# Patient Record
Sex: Female | Born: 1975 | Race: White | Hispanic: No | Marital: Married | Smoking: Never smoker
Health system: Southern US, Community
[De-identification: ages and names within clinical notes are randomized; demographics above are authoritative.]

## PROBLEM LIST (undated history)

## (undated) DIAGNOSIS — K743 Primary biliary cirrhosis: Secondary | ICD-10-CM

## (undated) DIAGNOSIS — T691XXA Chilblains, initial encounter: Secondary | ICD-10-CM

## (undated) DIAGNOSIS — L940 Localized scleroderma [morphea]: Secondary | ICD-10-CM

## (undated) DIAGNOSIS — N809 Endometriosis, unspecified: Secondary | ICD-10-CM

## (undated) DIAGNOSIS — E079 Disorder of thyroid, unspecified: Secondary | ICD-10-CM

## (undated) HISTORY — PX: BREAST BIOPSY: SHX20

## (undated) HISTORY — DX: Localized scleroderma (morphea): L94.0

## (undated) HISTORY — DX: Chilblains, initial encounter: T69.1XXA

## (undated) HISTORY — DX: Localized scleroderma (morphea): K74.3

---

## 2005-06-01 ENCOUNTER — Inpatient Hospital Stay (HOSPITAL_COMMUNITY): Admission: AD | Admit: 2005-06-01 | Discharge: 2005-06-01 | Payer: Self-pay | Admitting: Gynecology

## 2005-07-12 ENCOUNTER — Inpatient Hospital Stay (HOSPITAL_COMMUNITY): Admission: AD | Admit: 2005-07-12 | Discharge: 2005-07-12 | Payer: Self-pay | Admitting: Gynecology

## 2005-09-04 ENCOUNTER — Ambulatory Visit (HOSPITAL_COMMUNITY): Admission: RE | Admit: 2005-09-04 | Discharge: 2005-09-04 | Payer: Self-pay | Admitting: Gynecology

## 2005-10-01 ENCOUNTER — Inpatient Hospital Stay (HOSPITAL_COMMUNITY): Admission: AD | Admit: 2005-10-01 | Discharge: 2005-10-01 | Payer: Self-pay | Admitting: Gynecology

## 2005-10-04 ENCOUNTER — Inpatient Hospital Stay (HOSPITAL_COMMUNITY): Admission: AD | Admit: 2005-10-04 | Discharge: 2005-10-08 | Payer: Self-pay | Admitting: Gynecology

## 2005-11-04 ENCOUNTER — Inpatient Hospital Stay (HOSPITAL_COMMUNITY): Admission: RE | Admit: 2005-11-04 | Discharge: 2005-11-04 | Payer: Self-pay | Admitting: Gynecology

## 2005-11-22 ENCOUNTER — Inpatient Hospital Stay (HOSPITAL_COMMUNITY): Admission: AD | Admit: 2005-11-22 | Discharge: 2005-11-26 | Payer: Self-pay | Admitting: Gynecology

## 2005-11-23 ENCOUNTER — Encounter (INDEPENDENT_AMBULATORY_CARE_PROVIDER_SITE_OTHER): Payer: Self-pay | Admitting: *Deleted

## 2005-11-27 ENCOUNTER — Encounter: Admission: RE | Admit: 2005-11-27 | Discharge: 2005-12-27 | Payer: Self-pay | Admitting: Gynecology

## 2005-12-28 ENCOUNTER — Encounter: Admission: RE | Admit: 2005-12-28 | Discharge: 2006-01-21 | Payer: Self-pay | Admitting: Gynecology

## 2006-07-22 ENCOUNTER — Ambulatory Visit: Payer: Self-pay | Admitting: Gynecology

## 2006-08-17 ENCOUNTER — Ambulatory Visit: Payer: Self-pay | Admitting: Gynecology

## 2006-12-21 ENCOUNTER — Ambulatory Visit: Payer: Self-pay | Admitting: Gynecology

## 2009-01-12 ENCOUNTER — Encounter: Admission: RE | Admit: 2009-01-12 | Discharge: 2009-01-12 | Payer: Self-pay | Admitting: Specialist

## 2009-04-23 ENCOUNTER — Ambulatory Visit (HOSPITAL_COMMUNITY): Admission: RE | Admit: 2009-04-23 | Discharge: 2009-04-23 | Payer: Self-pay | Admitting: Obstetrics & Gynecology

## 2010-12-08 ENCOUNTER — Encounter: Payer: Self-pay | Admitting: Gynecology

## 2011-04-04 NOTE — Op Note (Signed)
NAME:  Connie Velez, Connie Velez      ACCOUNT NO.:  [id number]   MEDICAL RECORD NO.:  [id number]          PATIENT TYPE:  INP   LOCATION:  9121                          FACILITY:  [facility]   PHYSICIAN:  Charles A. Delcambre, MDDATE OF BIRTH:  21-Jan-1976   DATE OF PROCEDURE:  11/23/2005  DATE OF DISCHARGE:                                 OPERATIVE REPORT   PREOPERATIVE DIAGNOSES:  1.  Intrauterine pregnancy at 37 weeks and 2 days.  2.  Fetal intolerance of labor.   POSTOPERATIVE DIAGNOSES:  1.  Intrauterine pregnancy at 37 weeks and 2 days.  2.  Fetal intolerance of labor.  3.  Nuchal cord x1.   OPERATION/PROCEDURE:  Primary low transverse cesarean section.   SURGEON:  Charles A. Sydnee Cabal, M.D.   ASSISTANT:  None.   COMPLICATIONS:  None.   ESTIMATED BLOOD LOSS:  600 mL.   ANESTHESIA:  Epidural.   COUNTS:  Instrument, sponge and needle counts correct x2.   OPERATIVE FINDINGS:  Nuchal cord, tight, x1.  Vigorous female, Apgars 8 and  9.  Cord arterial blood gas PH 7.29, venous blood gas PH 7.34.  Weight 6  pounds 13 ounces.   SPECIMENS:  Placenta to pathology.   DESCRIPTION OF PROCEDURE:  The patient was taken to the operating room,  placed in the supine position.  Anesthesia was adequate.  Sterile prep and  drape was undertaken.  A Pfannenstiel incision was made with the knife and  taken down to the fascia.  The fascia was incised with the knife and Mayo  scissors.  Rectus sheath was sharply resected in the midline after the  rectus sheath was released superiorly and inferiorly.  Peritoneum was  entered with the Metzenbaum scissors.  Traction was used to extend this  incision.  Bladder blade was placed.  Vesicouterine peritoneum was incised  with the Metzenbaum scissors.  Blunt dissection was used to develop the  bladder flap.  Bladder blade was replaced.  The lower uterine segment was  incised with the knife to amniotomy without damage to the infant.  Traction  was used to  extend the incision.  Hand was inserted.  The occiput was close  to the intrauterine incision site.  Fundal pressure was applied by the  operator assistant to deliver the infant without difficulty.  Cord was  delivered through and the cord was clamped and the infant was shown to the  parents and handed off to the neonatologist in attendance.  Cord gases were  obtained and cord blood was taken.  Placenta was manually expressed and  handed off to the laboratory.  Infant cord blood specimen to be taken for  the cord blood registry.  The internal surfaces of the uterus were wiped  with a moistened lap.  Uterus was externalized for repair.  Uterus was  closed with a single layer of #1 chromic running locking suture.  Hemostasis  was excellent.  Single figure-of-eight suture of #1 chromic was placed for  hemostasis at the right angle of the incision.  Uterus reinternalized.  Hemostasis was excellent with the bladder flap in the uterus closure  incision site.  Irrigation was carried  out. Hemostasis was verified.  Subfascial hemostasis was excellent.  Fascia was then closed with #1 Vicryl  running nonlocking suture subcutaneous.  Hemostasis was excellent.  Skin was  closed with sterile skin clips.  Sterile dressing was applied. The patient  was taken to recovery with the physician in attendance having tolerated the  procedure well.      Charles A. Sydnee Cabal, MD  Electronically Signed     CAD/MEDQ  D:  11/23/2005  T:  11/24/2005  Job:  [id number]

## 2011-04-04 NOTE — Letter (Signed)
April 25, 2009     RE:   Connie Velez, Connie Velez  MR#   [id number]  ACC#        [id number]   MFM CONSULTATION REPORT     The patient is a 35 year old gravida 3, para 1, Caucasian female, who  presents for a prepregnancy consultation secondary to the following  complicating factors:  1. History of a small defect in the lower uterine segment, status post      cesarean section.  2. History of preterm labor.  3. Advanced maternal age.   HISTORY:  The patient has had longstanding infertility thought to be  secondary to endometriosis.  She had successfully conceived by in vitro  fertilization at [facility] with a singleton pregnancy.  Her pregnancy  apparently was relatively uncomplicated except for having preterm labor  for which she was treated with home uterine monitoring and Terbutaline  pump, and was hospitalized for IV magnesium sulfate.  She ultimately  delivered though at 37 weeks' gestation and underwent a primary low  transverse cesarean section for fetal intolerance of labor.  Cesarean  section was apparently uncomplicated as was her postpartum course.  The  patient, however, was subsequently worked up for infertility treatment  and was noted on a saline ultrasound to have a defect in the lower  uterine segment.  Followup MRI confirmed a small defect.  This defect is  described extensively in notes by Dr. Mia Creek, who has followed the  patient.  Most significant is that the defect was never thought to be a  through and through defect of the myometrium.  She was, however, taken  to the OR for subsequent repair of the defect by Dr. Jon Billings.  The  defect was noted to be very small and was approximated to be 5 mm x 3 mm  with a depth being 5 mm and width being 3 mm.   1. History of defect, status post defect in the lower uterine segment,      status post primary low transverse cesarean section:  Based upon      this defect, which was subsequently at least partially repaired by   Dr. Jon Billings.  I feel as though it is certainly safe for her to      undertake a future pregnancy.  This is her main concern and she      want to know before she underwent infertility treatment.  The      second would be the management of her subsequent pregnancy with      this small defect in the lower uterine segment.  I think her      pregnancy would be managed similarly to a patient with a previous      cesarean section.  I did discuss that the risk of uterine rupture      with any patient with a previous low transverse cesarean section      and if this defect probably would not significantly increase any      risk of uterine rupture.  That being said, I do think it would be      reasonable to potentially offer an amniocentesis for fetal lung      maturity at approximately 37 weeks to perform the cesarean section.      The repeat cesarean section a few weeks earlier, then would      routinely be done in a case of a previous low transverse cesarean      section (39 weeks).  I do  not believe that any kind of radiographic      imaging throughout the pregnancy would be helpful and that this has      not been shown to be useful in looking for early dehiscence for      early uterine rupture.  2. History of preterm labor:  As outlined above, the patient did have      episodes of preterm labor throughout her pregnancy.  However,      ultimately deliver at 37 weeks' gestation.  I discussed with her      that she probably would not be a candidate for progesterone therapy      due to the fact that she ultimately delivered at 37 weeks.  She did      express some desire to have the IM progesterone therapy, despite by      counseling and so that will have to be addressed in any future      pregnancy.  We also discussed the use of betamethasone and I      discussed with her that my management plan in any subsequent      pregnancy would be to withhold on betamethasone therapy unless she      had  true episodes of preterm labor with cervical change.  3. Advanced maternal age, based upon the fact that she will be 35      years of age at the time of delivery and subsequent pregnancy.  I      did discuss the increased risk of karyotype abnormalities.  I did      discuss however, is that there are various screening options      available including first trimester screening including antegraded      screening in the admitting women, who are labeled as advanced      maternal age for as opposed to invasive testing.  I gave her      information on integrated screening and she seemed very interested      in pursuing that in subsequent pregnancy.   I did discuss with her that should she successfully conceive, the  optimal situation would be a singleton pregnancy for her.  I also  offered to her that when and if she successfully conceives, I would be  glad to see her back for a followup consultation and could follow her in  any subsequent pregnancies.   Sincerely,      Connie Eth, MD      DCM/MEDQ  D:  04/25/2009  T:  04/26/2009  Job:  [id number]

## 2011-04-04 NOTE — Discharge Summary (Signed)
NAMEMarland Kitchen  Connie Velez, Connie Velez      ACCOUNT NO.:  [id number]   MEDICAL RECORD NO.:  [id number]          PATIENT TYPE:  INP   LOCATION:  9153                          [facility]ACILITY:  [facility]   PHYSICIAN:  Ginger Carne, MD  DATE OF BIRTH:  1975-12-27   DATE OF ADMISSION:  10/04/2005  DATE OF DISCHARGE:  10/08/2005                                 DISCHARGE SUMMARY   REASON FOR HOSPITALIZATION:  30-3/[redacted] weeks gestation, uterine contractions.   IN-HOSPITAL PROCEDURES:  Fetal monitoring and obstetrical ultrasound.   FINAL DIAGNOSIS:  30-3/[redacted] weeks gestation, uterine contractions.   HOSPITAL COURSE:  This is a 35 year old gravida, para 0, Caucasian female,  IVF status, [facility] December 12, 2005, 30-3/[redacted] weeks gestation at the time of  admission with contractions every three minutes apart for three to four  hours. Prior to her admission, she was managed with nifedipine 20 mg up to  four times a day without abatement of said contractions. On admission, the  patient underwent an obstetrical ultrasound which demonstrated no evidence  for intrauterine bleeding, abruptio, or any other abnormalities. Her cervix  was closed on admission, and cervix measured 3.2 cm on ultrasound scan with  a good AFI. She was provided a betamethasone protocol for 24 hours and  Unasyn 3.0 g IV every 6 hours. NICU consultation as well. She was initially  started on magnesium sulfate 2 g an hour which was decreased to 1 g an hour,  and then on October 07, 2005, a terbutaline pump via [place] was installed.  The patient was taken off of magnesium sulfate and only had an occasional  contraction over the past 48 hours. The patient was observant of certain  sleeping positions in addition to activities which tended to stimulate  uterine activity. She was discharged on a terbutaline pump with specific  instructions for bolusing and has nifedipine 10 to 20 mg up to 4 times a day  for additional management as necessary. The patient was  advised to utilize  Citrucel as needed for constipation, continue her prenatal vitamins, and to  contact the office for uterine activity beyond half hour or any other  concerns. The patient will be seen in the office on November 27. The patient  has my cell phone number. She will be discharged to her parent's home.      Ginger Carne, MD  Electronically Signed     SHB/MEDQ  D:  10/08/2005  T:  10/08/2005  Job:  [id number]

## 2011-04-04 NOTE — Discharge Summary (Signed)
NAME:  Connie Velez, Connie Velez      ACCOUNT NO.:  [id number]   MEDICAL RECORD NO.:  [id number]          PATIENT TYPE:  INP   LOCATION:  9121                          [facility]ILITY[facility]   PHYSICIAN:  Ginger Carne, MD  DATE OF BIRTH:  Jan 21, 1976   DATE OF ADMISSION:  11/22/2005  DATE OF DISCHARGE:  11/26/2005                                 DISCHARGE SUMMARY   REASON FOR HOSPITALIZATION:  Uterine irritability with evidence of variable  and late decelerations on non-stress testing.   IN-HOSPITAL PROCEDURES:  1.  Primary low transverse cesarean section.  2.  Medical induction.   FINAL DIAGNOSES:  1.  Non-reassuring fetal heart rate.  2.  Preterm viable delivery of female infant.   HOSPITAL COURSE:  This patient is a 35 year old gravida 1, para 1-0-0-1  Caucasian female 37-2/[redacted] weeks gestation with an [facility] of December 12, 2005  admitted in the late evening of November 22, 2005 because of uterine  irritability.  The patient was monitored and noticed to have spontaneous  variable decelerations.  Ultrasound confirmed no evidence for abruptio, but  visually demonstrated spontaneous decelerations.  On the morning of November 23, 2004 the patient underwent AROM with Pitocin induction.  During the  preceding evening the patient continued to demonstrate variable  decelerations on non-stress testing.  During the course of her labor the  patient developed late decelerations in addition to significant variable  decelerations and at 7-8 cm the patient underwent a primary low transverse  cesarean section.   A preterm viable female was delivered with Apgars of 8/9, cord pH 7.29, and  weight 6 pounds 13 ounces.  Postoperative course was otherwise uneventful.  She was afebrile, voided well.  Incision was dry.  The patient underwent  mitral valve prolapse prophylaxis during her course of labor and  postoperatively.  Patient was able to satisfactorily breast feed as well.  Instructions included contacting  the office for temperature elevation above  100.4 degrees Fahrenheit, increasing abdominal pain, incisional pain and/or  drainage, increased vaginal bleeding or constipation.  She was discharged  with Macrobid 100 mg twice a day for chronic urinary tract infection which  she will discontinue in three days to see if she will not require said  medication.  The patient was also prescribed Percocet 5/325 one to two every  four to six hours and ibuprofen 600 mg up to four times a day.  Patient will  return in one day to have her staples removed and for six-week postoperative  check-up.  Postoperative hemoglobin was 8.8, hematocrit 25.4.      Ginger Carne, MD  Electronically Signed     SHB/MEDQ  D:  11/26/2005  T:  11/26/2005  Job:  [id number]

## 2012-09-17 DIAGNOSIS — E063 Autoimmune thyroiditis: Secondary | ICD-10-CM | POA: Insufficient documentation

## 2012-10-19 DIAGNOSIS — Z8249 Family history of ischemic heart disease and other diseases of the circulatory system: Secondary | ICD-10-CM | POA: Insufficient documentation

## 2012-10-26 ENCOUNTER — Ambulatory Visit: Payer: Self-pay | Admitting: Internal Medicine

## 2012-11-08 ENCOUNTER — Ambulatory Visit: Payer: Self-pay | Admitting: Internal Medicine

## 2014-09-07 ENCOUNTER — Ambulatory Visit: Payer: Self-pay | Admitting: Otolaryngology

## 2015-06-14 ENCOUNTER — Other Ambulatory Visit: Payer: Self-pay | Admitting: Obstetrics & Gynecology

## 2015-06-15 LAB — CYTOLOGY - PAP

## 2015-12-28 DIAGNOSIS — E559 Vitamin D deficiency, unspecified: Secondary | ICD-10-CM | POA: Insufficient documentation

## 2015-12-28 DIAGNOSIS — Z8639 Personal history of other endocrine, nutritional and metabolic disease: Secondary | ICD-10-CM | POA: Insufficient documentation

## 2016-02-06 DIAGNOSIS — E538 Deficiency of other specified B group vitamins: Secondary | ICD-10-CM | POA: Insufficient documentation

## 2016-10-26 ENCOUNTER — Encounter: Payer: Self-pay | Admitting: Medical Oncology

## 2016-10-26 ENCOUNTER — Emergency Department: Payer: BLUE CROSS/BLUE SHIELD

## 2016-10-26 ENCOUNTER — Emergency Department
Admission: EM | Admit: 2016-10-26 | Discharge: 2016-10-26 | Disposition: A | Discharge status: Home / Self Care | Payer: BLUE CROSS/BLUE SHIELD | Attending: Emergency Medicine | Admitting: Emergency Medicine

## 2016-10-26 DIAGNOSIS — Z79899 Other long term (current) drug therapy: Secondary | ICD-10-CM | POA: Diagnosis not present

## 2016-10-26 DIAGNOSIS — R079 Chest pain, unspecified: Secondary | ICD-10-CM | POA: Insufficient documentation

## 2016-10-26 DIAGNOSIS — R0789 Other chest pain: Secondary | ICD-10-CM | POA: Diagnosis present

## 2016-10-26 HISTORY — DX: Disorder of thyroid, unspecified: E07.9

## 2016-10-26 LAB — CBC
HCT: 41.4 % (ref 35.0–47.0)
Hemoglobin: 14 g/dL (ref 12.0–16.0)
MCH: 31.7 pg (ref 26.0–34.0)
MCHC: 33.9 g/dL (ref 32.0–36.0)
MCV: 93.7 fL (ref 80.0–100.0)
PLATELETS: 247 10*3/uL (ref 150–440)
RBC: 4.42 MIL/uL (ref 3.80–5.20)
RDW: 12.2 % (ref 11.5–14.5)
WBC: 5.4 10*3/uL (ref 3.6–11.0)

## 2016-10-26 LAB — BASIC METABOLIC PANEL
ANION GAP: 6 (ref 5–15)
BUN: 14 mg/dL (ref 6–20)
CALCIUM: 9.3 mg/dL (ref 8.9–10.3)
CO2: 25 mmol/L (ref 22–32)
CREATININE: 0.71 mg/dL (ref 0.44–1.00)
Chloride: 108 mmol/L (ref 101–111)
GFR calc Af Amer: >60 mL/min (ref >60)
Glucose, Bld: 96 mg/dL (ref 65–99)
Potassium: 4.2 mmol/L (ref 3.5–5.1)
Sodium: 139 mmol/L (ref 135–145)

## 2016-10-26 LAB — TROPONIN I: Troponin I: <0.03 ng/mL (ref <0.03)

## 2016-10-26 LAB — FIBRIN DERIVATIVES D-DIMER (ARMC ONLY): Fibrin derivatives D-dimer (ARMC): 231 (ref 0–499)

## 2016-10-26 MED ORDER — GI COCKTAIL ~~LOC~~
30.0000 mL | Freq: Once | ORAL | Status: AC
  Administered 2016-10-26: 30 mL via ORAL
  Filled 2016-10-26: qty 30

## 2016-10-26 NOTE — ED Provider Notes (Signed)
[facility] Emergency Department Provider Note  ____________________________________________  Time seen: Approximately 12:20 PM  I have reviewed the triage vital signs and the nursing notes.   HISTORY  Chief Complaint Chest Pain    HPI Connie Velez is a 40 y.o. female on oral contraceptives for endometriosis presenting with chest pain. The patient reportsthat around 9 AM, she was drying her hair when she developed a crampy pain in the midsternal and right medial breast region. It is worse with coughing or talking, but does not worsen with deep breaths. It is not associated with radiation, shortness of breath, diaphoresis, nausea or vomiting, palpitations, lightheadedness or syncope. She has not had any lower extremity swelling or calf pain. She has not taken any recent long trips. She does report increased stress at work.  SH: Denies smoking tobacco or cocaine. FH: Brother with PE.   Past Medical History:  Diagnosis Date  . Thyroid disease     There are no active problems to display for this patient.   No past surgical history on file.  Current Outpatient Rx  . Order #: [id number] Class: Historical Med  . Order #: [id number] Class: Historical Med  . Order #: [id number] Class: Historical Med  . Order #: [id number] Class: Historical Med  . Order #: [id number] Class: Historical Med  . Order #: [id number] Class: Historical Med  . Order #: [id number] Class: Historical Med  . Order #: [id number] Class: Historical Med    Allergies Sulfa antibiotics  No family history on file.  Social History Social History  Substance Use Topics  . Smoking status: Not on file  . Smokeless tobacco: Not on file  . Alcohol use Not on file    Review of Systems Constitutional: No fever/chills.No lightheadedness or syncope. Eyes: No visual changes. ENT: No sore throat. No congestion or rhinorrhea. Cardiovascular: Positive chest pain. Denies palpitations. Respiratory: Denies  shortness of breath.  No cough. Gastrointestinal: No abdominal pain.  No nausea, no vomiting.  No diarrhea.  No constipation. Genitourinary: Negative for dysuria. Musculoskeletal: Negative for back pain. No lower extremity swelling or pain. Skin: Negative for rash. Neurological: Negative for headaches. No focal numbness, tingling or weakness.  Psych: Reports situational anxiety related to  10-point ROS otherwise negative.  ____________________________________________   PHYSICAL EXAM:  VITAL SIGNS: ED Triage Vitals [10/26/16 1018]  Enc Vitals Group     BP 116/72     Pulse Rate 88     Resp 16     Temp 97.7 F (36.5 C)     Temp Source Oral     SpO2 100 %     Weight 138 lb (62.6 kg)     Height 5\' 6"  (1.676 m)     Head Circumference      Peak Flow      Pain Score 4     Pain Loc      Pain Edu?      Excl. in GC?     Constitutional: Alert and oriented. Well appearing and in no acute distress. Answers questions appropriately. Eyes: Conjunctivae are normal.  EOMI. No scleral icterus. Head: Atraumatic. Nose: No congestion/rhinnorhea. Mouth/Throat: Mucous membranes are moist.  Neck: No stridor.  Supple.  No JVD. Cardiovascular: Normal rate, regular rhythm. No murmurs, rubs or gallops.  Respiratory: Normal respiratory effort.  No accessory muscle use or retractions. Lungs CTAB.  No wheezes, rales or ronchi. Gastrointestinal: Soft, nontender and nondistended.  No guarding or rebound.  No peritoneal signs. Musculoskeletal: No LE edema. No ttp in the  calves or palpable cords.  Negative Homan's sign. Neurologic:  A&Ox3.  Speech is clear.  Face and smile are symmetric.  EOMI.  Moves all extremities well. Skin:  Skin is warm, dry and intact. No rash noted. Psychiatric: Mood and affect are normal. Speech and behavior are normal.  Normal judgement.  ____________________________________________   LABS (all labs ordered are listed, but only abnormal results are displayed)  Labs  Reviewed  BASIC METABOLIC PANEL  CBC  TROPONIN I  FIBRIN DERIVATIVES D-DIMER ([facility] ONLY)   ____________________________________________  EKG  ED ECG REPORT I, Rockne MenghiniNorman, Anne-Caroline, the attending physician, personally viewed and interpreted this ECG.   Date: 10/26/2016  EKG Time: 1015  Rate: 96  Rhythm: normal sinus rhythm  Axis: normal  Intervals:none  ST&T Change: Poor baseline tracing but no ST elevation. No evidence of Brugada syndrome, hypertrophy, or prolonged QTC.  ____________________________________________  RADIOLOGY  Dg Chest 2 View  Result Date: 10/26/2016 CLINICAL DATA:  Chest pain EXAM: CHEST  2 VIEW COMPARISON:  11/08/2012 chest radiograph. FINDINGS: Stable cardiomediastinal silhouette with normal heart size. No pneumothorax. No pleural effusion. Lungs appear clear, with no acute consolidative airspace disease and no pulmonary edema. IMPRESSION: No active cardiopulmonary disease. Electronically Signed   By: Delbert PhenixJason A Poff M.D.   On: 10/26/2016 10:57    ____________________________________________   PROCEDURES  Procedure(s) performed: None  Procedures  Critical Care performed: No ____________________________________________   INITIAL IMPRESSION / ASSESSMENT AND PLAN / ED COURSE  Pertinent labs & imaging results that were available during my care of the patient were reviewed by me and considered in my medical decision making (see chart for details).  40 y.o. female who takes oral contraceptives and has family history positive for PE presenting with chest pain. Overall, the patient is well-appearing, is not hypoxic, and has no tachycardia. She has no evidence of DVT. The risk of PE is low in this patient, and we'll get a d-dimer for further evaluation given her family history and oral contraceptive use, plus the fact that she is traveling next week. Her EKG does not show any ischemic changes, nor does she have a positive troponin. ACS or MI is very unlikely  in this patient with no cardiac risk factors. It is possible this patient has a GI related pain, so we'll give her GI cocktail; she does report drinking more alcohol and eating more last night and usual. Do not see any evidence of arrhythmia.  If the patient's workup in the emergency department is reassuring, I anticipate discharge home with close PMD follow-up. Return precautions were discussed. ____________________________________________  FINAL CLINICAL IMPRESSION(S) / ED DIAGNOSES  Final diagnoses:  Right-sided chest pain    Clinical Course       NEW MEDICATIONS STARTED DURING THIS VISIT:  Discharge Medication List as of 10/26/2016 12:56 PM        Rockne MenghiniAnne-Caroline Lamark Schue, MD 10/26/16 2053

## 2016-10-26 NOTE — ED Triage Notes (Signed)
Pt reports she began having rt sided chest pain about pta. Pt reports pain is a cramping/aching pain. Denies sob. Reports feeling nervous. Pt in NAD at this time.

## 2016-10-26 NOTE — Discharge Instructions (Signed)
Please eat small regular meals a bland food throughout the day. Drink alcohol in moderation.  Return to the emergency department if you develop severe pain, lightheadedness or fainting, palpitations, shortness of breath, or any other symptoms concerning to you.

## 2017-08-19 ENCOUNTER — Other Ambulatory Visit: Payer: Self-pay | Admitting: Obstetrics & Gynecology

## 2017-08-19 ENCOUNTER — Other Ambulatory Visit: Payer: Self-pay | Admitting: Family Medicine

## 2017-08-19 DIAGNOSIS — R928 Other abnormal and inconclusive findings on diagnostic imaging of breast: Secondary | ICD-10-CM

## 2017-08-21 ENCOUNTER — Other Ambulatory Visit: Payer: BLUE CROSS/BLUE SHIELD

## 2017-08-21 ENCOUNTER — Other Ambulatory Visit: Payer: Self-pay | Admitting: Obstetrics & Gynecology

## 2017-08-21 ENCOUNTER — Ambulatory Visit
Admission: RE | Admit: 2017-08-21 | Discharge: 2017-08-21 | Disposition: A | Discharge status: Home / Self Care | Payer: BLUE CROSS/BLUE SHIELD | Source: Ambulatory Visit | Attending: Obstetrics & Gynecology | Admitting: Obstetrics & Gynecology

## 2017-08-21 DIAGNOSIS — R928 Other abnormal and inconclusive findings on diagnostic imaging of breast: Secondary | ICD-10-CM

## 2017-08-21 DIAGNOSIS — N6310 Unspecified lump in the right breast, unspecified quadrant: Secondary | ICD-10-CM

## 2017-08-27 ENCOUNTER — Other Ambulatory Visit: Payer: Self-pay | Admitting: Obstetrics & Gynecology

## 2017-08-27 ENCOUNTER — Ambulatory Visit
Admission: RE | Admit: 2017-08-27 | Discharge: 2017-08-27 | Disposition: A | Discharge status: Home / Self Care | Payer: BLUE CROSS/BLUE SHIELD | Source: Ambulatory Visit | Attending: Obstetrics & Gynecology | Admitting: Obstetrics & Gynecology

## 2017-08-27 DIAGNOSIS — N6310 Unspecified lump in the right breast, unspecified quadrant: Secondary | ICD-10-CM

## 2018-12-12 IMAGING — MG 2D DIGITAL DIAGNOSTIC UNILATERAL RIGHT MAMMOGRAM WITH CAD AND AD
5 series · 6 of 9 positions shown · non-contrast
Comparison: Previous exam(s).

CLINICAL DATA: Screening recall for a possible right breast mass.

EXAM:
2D DIGITAL DIAGNOSTIC RIGHT MAMMOGRAM WITH CAD AND ADJUNCT TOMO
ULTRASOUND RIGHT BREAST

[R MLO]
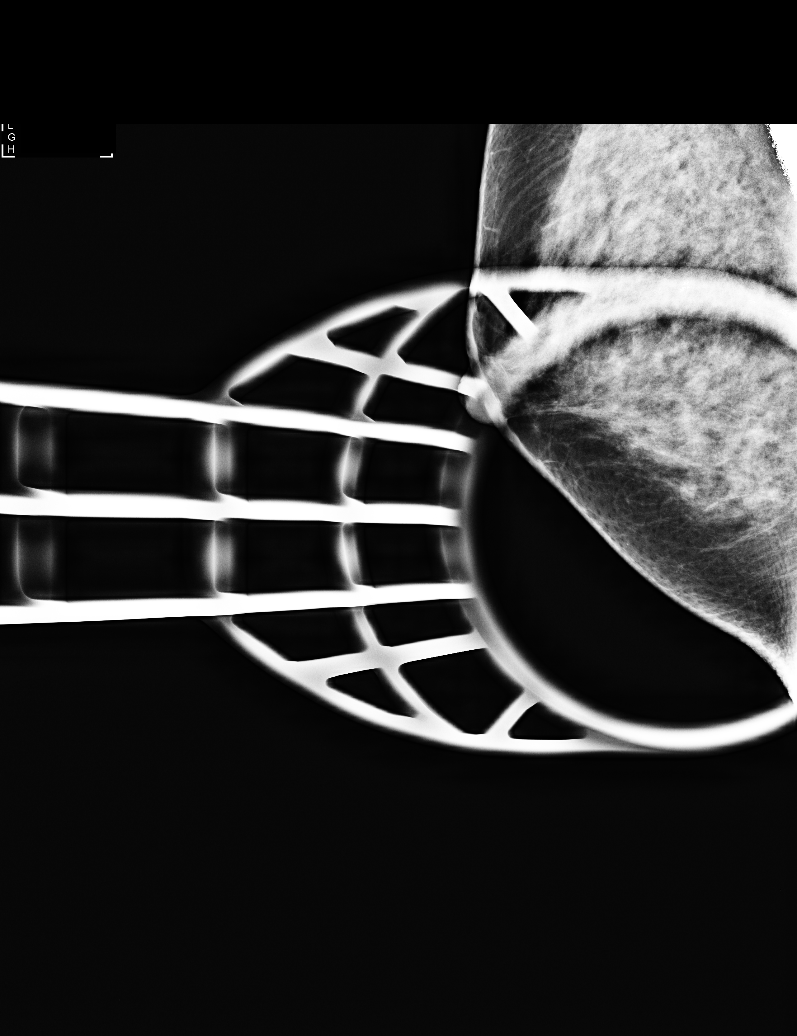

[R MLO synth-2D]
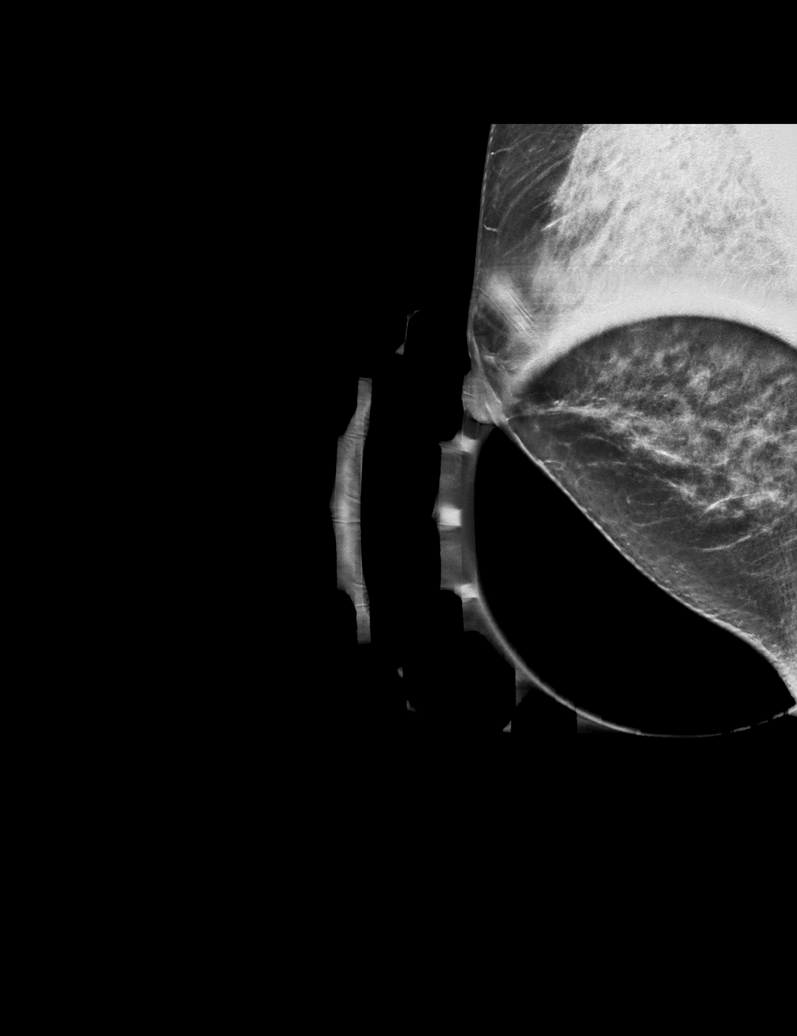

[R CC]
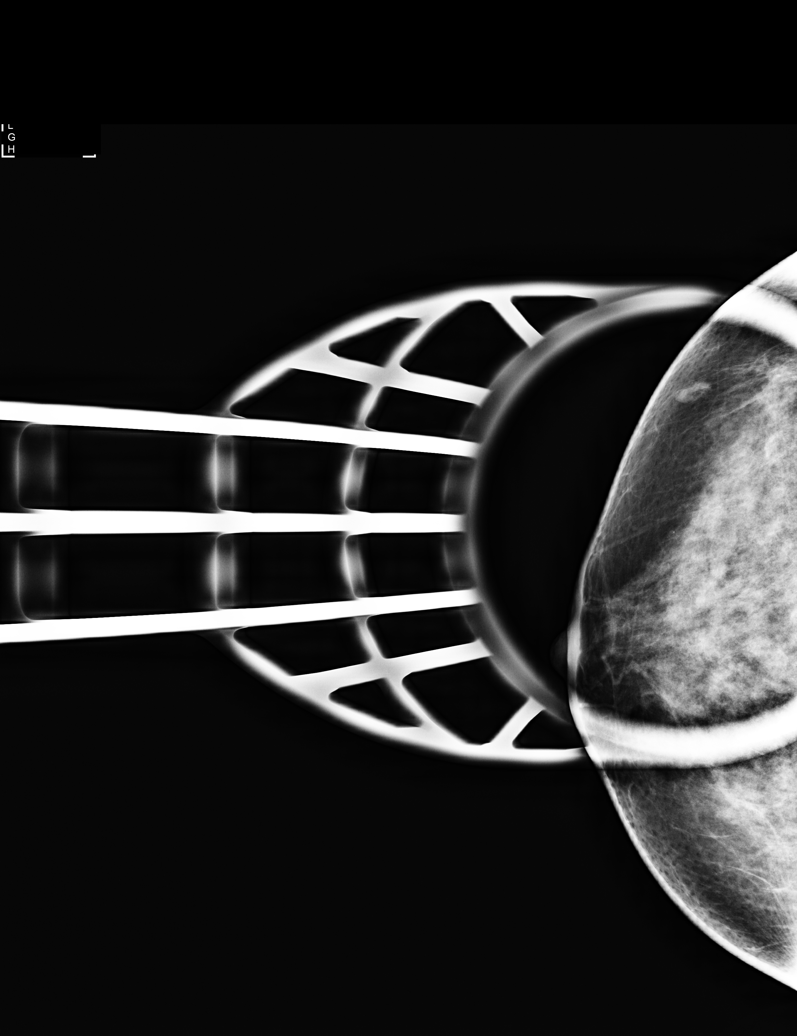

[R CC synth-2D]
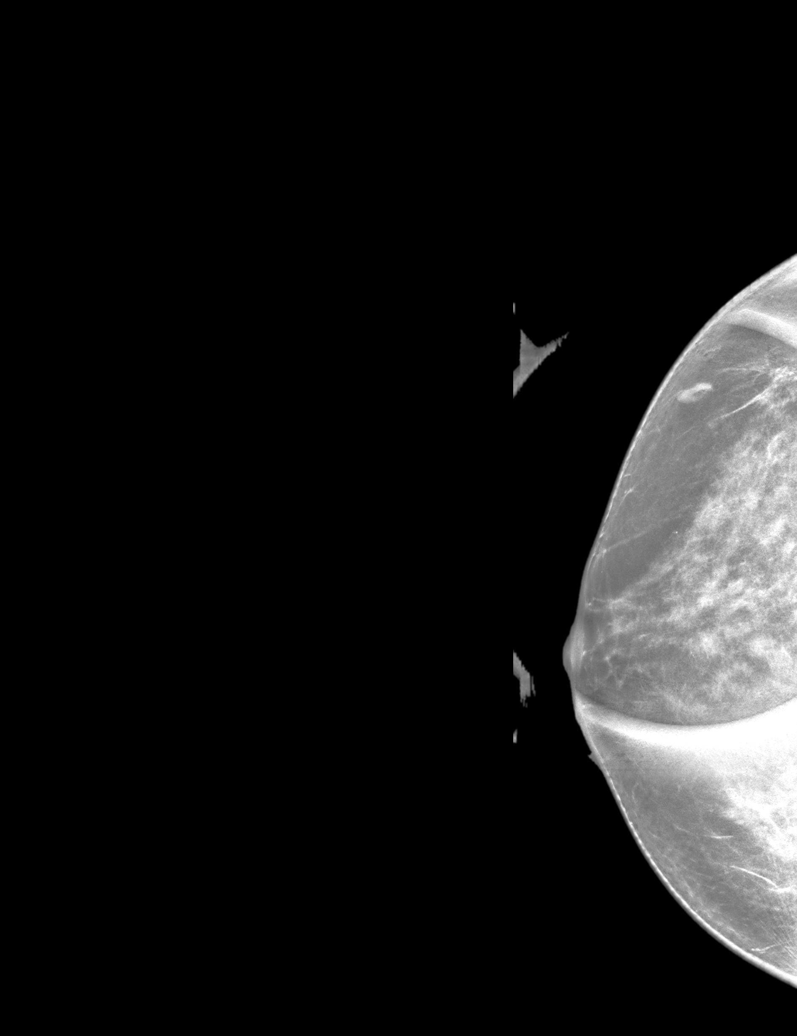

[R CC tomo · 2 of 60 frames shown]
[frame 20/60]
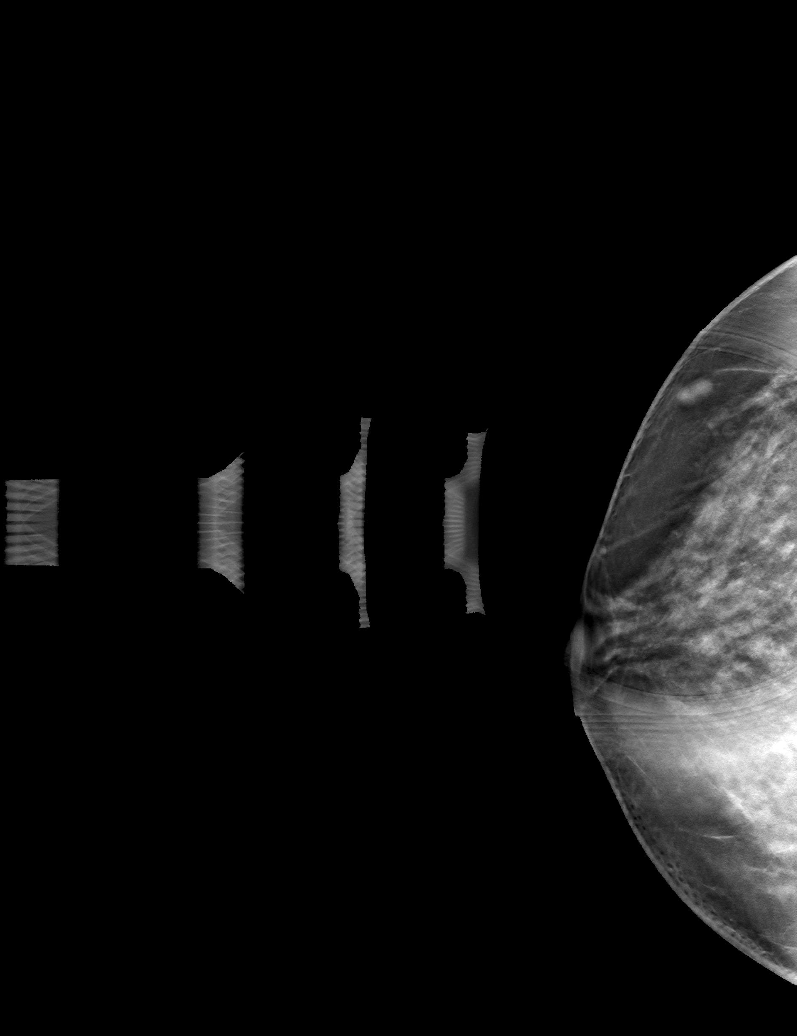
[frame 31/60]
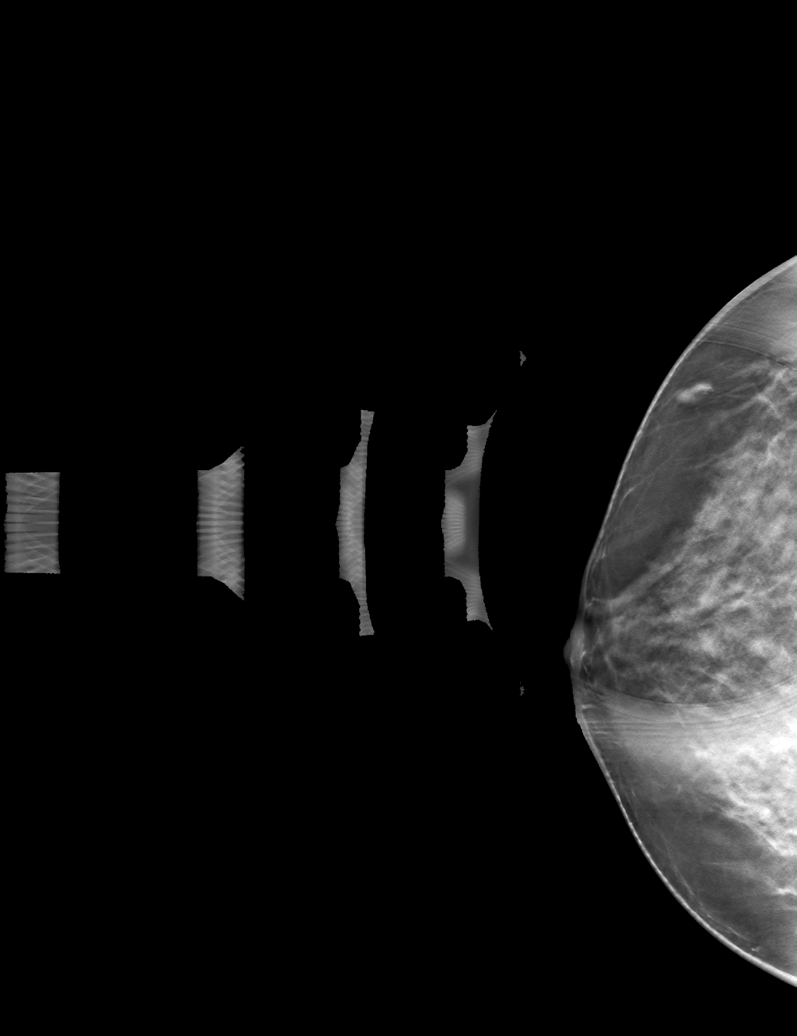

[6 of 9 positions shown; findings below may reference images not displayed]

ACR Breast Density Category c: The breast tissue is heterogeneously
dense, which may obscure small masses.
FINDINGS: On the diagnostic spot-compression 2D and 3D images, the possible
mass persists. It lies in the lower inner quadrant, middle depth. Is
rounded circumscribed measuring 3 mm. There are no other discrete
masses. There are no areas of architectural distortion. No
suspicious calcifications.

Mammographic images were processed with CAD.

On physical exam, no mass is palpated along the inferior or lower
inner right breast.

Targeted ultrasound is performed, showing heterogeneous
fibroglandular tissue. There is a questionable cyst in the posterior
aspect the right breast at 6 o'clock, 1 cm from nipple, measuring 3
x 2 x 2 mm. There is no definite mass or cyst to correspond to the
mammographic finding.

Sonographic evaluation the right axilla shows no abnormal or
enlarged lymph nodes.
IMPRESSION: 1. Small circumscribed mass in the right breast, which appears new
when compared to the previous year's mammogram. There is no
sonographic correlate. Biopsy is recommended.

RECOMMENDATION:
Stereotactic core needle biopsy of a small right breast mass PE

I have discussed the findings and recommendations with the patient.
Results were also provided in writing at the conclusion of the
visit. If applicable, a reminder letter will be sent to the patient
regarding the next appointment.

BI-RADS CATEGORY  4: Suspicious.

## 2019-03-28 DIAGNOSIS — F419 Anxiety disorder, unspecified: Secondary | ICD-10-CM | POA: Insufficient documentation

## 2019-08-29 ENCOUNTER — Other Ambulatory Visit: Payer: Self-pay | Admitting: Surgery

## 2019-08-29 DIAGNOSIS — N809 Endometriosis, unspecified: Secondary | ICD-10-CM

## 2019-08-29 DIAGNOSIS — R1031 Right lower quadrant pain: Secondary | ICD-10-CM

## 2019-09-02 ENCOUNTER — Ambulatory Visit
Admission: RE | Admit: 2019-09-02 | Discharge: 2019-09-02 | Disposition: A | Discharge status: Home / Self Care | Payer: BC Managed Care – PPO | Source: Ambulatory Visit | Attending: Surgery | Admitting: Surgery

## 2019-09-02 DIAGNOSIS — N809 Endometriosis, unspecified: Secondary | ICD-10-CM

## 2019-09-02 DIAGNOSIS — R1031 Right lower quadrant pain: Secondary | ICD-10-CM

## 2019-09-02 MED ORDER — IOPAMIDOL (ISOVUE-300) INJECTION 61%
100.0000 mL | Freq: Once | INTRAVENOUS | Status: AC | PRN
  Administered 2019-09-02: 09:00:00 100 mL via INTRAVENOUS

## 2020-03-14 ENCOUNTER — Other Ambulatory Visit (INDEPENDENT_AMBULATORY_CARE_PROVIDER_SITE_OTHER): Payer: Self-pay | Admitting: Vascular Surgery

## 2020-03-14 DIAGNOSIS — L819 Disorder of pigmentation, unspecified: Secondary | ICD-10-CM

## 2020-03-15 ENCOUNTER — Ambulatory Visit (INDEPENDENT_AMBULATORY_CARE_PROVIDER_SITE_OTHER): Payer: BC Managed Care – PPO | Admitting: Vascular Surgery

## 2020-03-15 ENCOUNTER — Encounter (INDEPENDENT_AMBULATORY_CARE_PROVIDER_SITE_OTHER): Payer: Self-pay | Admitting: Vascular Surgery

## 2020-03-15 ENCOUNTER — Other Ambulatory Visit: Payer: Self-pay

## 2020-03-15 ENCOUNTER — Ambulatory Visit (INDEPENDENT_AMBULATORY_CARE_PROVIDER_SITE_OTHER): Payer: BC Managed Care – PPO

## 2020-03-15 VITALS — BP 120/76 | HR 72 | Ht 66.0 in | Wt 139.0 lb

## 2020-03-15 DIAGNOSIS — F411 Generalized anxiety disorder: Secondary | ICD-10-CM | POA: Insufficient documentation

## 2020-03-15 DIAGNOSIS — I7300 Raynaud's syndrome without gangrene: Secondary | ICD-10-CM | POA: Diagnosis not present

## 2020-03-15 DIAGNOSIS — L819 Disorder of pigmentation, unspecified: Secondary | ICD-10-CM

## 2020-03-15 DIAGNOSIS — I872 Venous insufficiency (chronic) (peripheral): Secondary | ICD-10-CM | POA: Diagnosis not present

## 2020-03-15 DIAGNOSIS — I890 Lymphedema, not elsewhere classified: Secondary | ICD-10-CM | POA: Insufficient documentation

## 2020-03-15 NOTE — Progress Notes (Signed)
MRN : [id number]  Connie Velez [name] is a 44 y.o. (06-08-1976) female who presents with chief complaint of No chief complaint on file. Marland Kitchen  History of Present Illness:   The patient is seen for the evaluation of cyanotic toes associated with Raynaud's changes. The patient notes her toes turned pale and then blue and become uncomfortable. Exposure to cold environments makes the symptoms much worse. The changes have been going on for quite some time. There is no history of trauma or repetitive injury. The patient does note some peeling of the skin or blistering of the toes.  The patient has not been taking Norvasc  There is no history of malignancy but she does have a history of Graves' disease as well as Hashimoto's thyroiditis (autoimmune disease).  The patient denies claudication symptoms or rest pain symptoms. The patient denies history of DVT, PE or superficial thrombophlebitis.  ABIs were obtained which are normal bilaterally  No outpatient medications have been marked as taking for the 03/15/20 encounter (Appointment) with Gilda Crease, Latina Craver, MD.    Past Medical History:  Diagnosis Date  . Thyroid disease     No past surgical history on file.  Social History Social History   Tobacco Use  . Smoking status: Not on file  Substance Use Topics  . Alcohol use: Not on file  . Drug use: Not on file    Family History No family history of bleeding/clotting disorders, porphyria or autoimmune disease   Allergies  Allergen Reactions  . Sulfa Antibiotics      REVIEW OF SYSTEMS (Negative unless checked)  Constitutional: [] Weight loss  [] Fever  [] Chills Cardiac: [] Chest pain   [] Chest pressure   [] Palpitations   [] Shortness of breath when laying flat   [] Shortness of breath with exertion. Vascular:  [] Pain in legs with walking   [] Pain in legs at rest  [] History of DVT   [] Phlebitis   [] Swelling in legs   [] Varicose veins   [] Non-healing ulcers Pulmonary:   [] Uses  home oxygen   [] Productive cough   [] Hemoptysis   [] Wheeze  [] COPD   [] Asthma Neurologic:  [] Dizziness   [] Seizures   [] History of stroke   [] History of TIA  [] Aphasia   [] Vissual changes   [] Weakness or numbness in arm   [] Weakness or numbness in leg Musculoskeletal:   [] Joint swelling   [] Joint pain   [] Low back pain Hematologic:  [] Easy bruising  [] Easy bleeding   [] Hypercoagulable state   [] Anemic Gastrointestinal:  [] Diarrhea   [] Vomiting  [] Gastroesophageal reflux/heartburn   [] Difficulty swallowing. Genitourinary:  [] Chronic kidney disease   [] Difficult urination  [] Frequent urination   [] Blood in urine Skin:  [x] Rashes   [] Ulcers  Psychological:  [] History of anxiety   []  History of major depression.  Physical Examination  There were no vitals filed for this visit. There is no height or weight on file to calculate BMI. Gen: WD/WN, NAD Head: [place]/AT, No temporalis wasting.  Ear/Nose/Throat: Hearing grossly intact, nares w/o erythema or drainage, poor dentition Eyes: PER, EOMI, sclera nonicteric.  Neck: Supple, no masses.  No bruit or JVD.  Pulmonary:  Good air movement, clear to auscultation bilaterally, no use of accessory muscles.  Cardiac: RRR, normal S1, S2, no Murmurs. Vascular: Both feet show deep cyanosis of all 10 toes.  There is 1 to 2-second capillary refill.  There are no blisters at present.  Her feet are cool to the touch.  No significant finger issues noted at this time  Vessel Right Left  Radial Palpable Palpable  PT Palpable Palpable  DP Palpable Palpable  Gastrointestinal: soft, non-distended. No guarding/no peritoneal signs.  Musculoskeletal: M/S 5/5 throughout.  No deformity or atrophy.  Neurologic: CN 2-12 intact. Pain and light touch intact in extremities.  Symmetrical.  Speech is fluent. Motor exam as listed above. Psychiatric: Judgment intact, Mood & affect appropriate for pt's clinical situation.   CBC Lab Results  Component Value Date   WBC 5.4  10/26/2016   HGB 14.0 10/26/2016   HCT 41.4 10/26/2016   MCV 93.7 10/26/2016   PLT 247 10/26/2016    BMET    Component Value Date/Time   NA 139 10/26/2016 1029   K 4.2 10/26/2016 1029   CL 108 10/26/2016 1029   CO2 25 10/26/2016 1029   GLUCOSE 96 10/26/2016 1029   BUN 14 10/26/2016 1029   CREATININE 0.71 10/26/2016 1029   CALCIUM 9.3 10/26/2016 1029   GFRNONAA >60 10/26/2016 1029   GFRAA >60 10/26/2016 1029   CrCl cannot be calculated (Patient's most recent lab result is older than the maximum 21 days allowed.).  COAG No results found for: INR, PROTIME  Radiology No results found.   Assessment/Plan 1. Raynaud's phenomenon without gangrene Recommend:  The patient is currently tolerating the Raynaud's changes fairly well. Lengthy discussion regarding keeping the feet and the hands warm; gloves, washing with only warm water (especially avoiding cold water immersion) and using wool socks, specifically Smartwool was recommended but also Sockwell brand as this would allow for moderate graduated compression.  Possibility of using Norvasc was discussed but it was decided to hold off for now until the benefits of conservative therapy is assessed.  The use of Pletal was also reviewed as well as the fact that it is an off label use which is typically reserved for failures of Norvasc and conservative therapy.  Also it is found to be useful in patients that have ulcerated.  The patient will follow up PRN if the changes worsen or persist.   2. Chronic venous insufficiency Recommend:  The patient is currently tolerating the Raynaud's changes fairly well. Lengthy discussion regarding keeping the feet and the hands warm; gloves, washing with only warm water (especially avoiding cold water immersion) and using wool socks, specifically Smartwool was recommended and also Sockwell brand as this would also allow for moderate graduated compression.  Possibility of using Norvasc was  discussed but it was decided to hold off for now until the benefits of conservative therapy is assessed.  The use of Pletal was also reviewed as well as the fact that it is an off label use which is typically reserved for failures of Norvasc and conservative therapy.  Also it is found to be useful in patients that have ulcerated.  The patient will follow up PRN if the changes worsen or persist.           Hortencia Pilar, MD  03/15/2020 7:59 AM

## 2020-03-16 ENCOUNTER — Encounter (INDEPENDENT_AMBULATORY_CARE_PROVIDER_SITE_OTHER): Payer: Self-pay | Admitting: Vascular Surgery

## 2020-03-16 DIAGNOSIS — I7300 Raynaud's syndrome without gangrene: Secondary | ICD-10-CM | POA: Insufficient documentation

## 2020-10-04 ENCOUNTER — Other Ambulatory Visit: Payer: Self-pay | Admitting: Obstetrics & Gynecology

## 2020-10-04 DIAGNOSIS — R928 Other abnormal and inconclusive findings on diagnostic imaging of breast: Secondary | ICD-10-CM

## 2020-10-09 ENCOUNTER — Other Ambulatory Visit: Payer: Self-pay

## 2020-10-09 ENCOUNTER — Ambulatory Visit
Admission: RE | Admit: 2020-10-09 | Discharge: 2020-10-09 | Disposition: A | Discharge status: Home / Self Care | Payer: BC Managed Care – PPO | Source: Ambulatory Visit | Attending: Obstetrics & Gynecology | Admitting: Obstetrics & Gynecology

## 2020-10-09 DIAGNOSIS — R928 Other abnormal and inconclusive findings on diagnostic imaging of breast: Secondary | ICD-10-CM

## 2021-10-21 ENCOUNTER — Other Ambulatory Visit: Payer: Self-pay | Admitting: Obstetrics & Gynecology

## 2021-10-21 DIAGNOSIS — R928 Other abnormal and inconclusive findings on diagnostic imaging of breast: Secondary | ICD-10-CM

## 2021-10-29 ENCOUNTER — Ambulatory Visit
Admission: RE | Admit: 2021-10-29 | Discharge: 2021-10-29 | Disposition: A | Discharge status: Home / Self Care | Payer: BC Managed Care – PPO | Source: Ambulatory Visit | Attending: Obstetrics & Gynecology | Admitting: Obstetrics & Gynecology

## 2021-10-29 DIAGNOSIS — R928 Other abnormal and inconclusive findings on diagnostic imaging of breast: Secondary | ICD-10-CM

## 2022-03-03 ENCOUNTER — Other Ambulatory Visit: Payer: Self-pay | Admitting: Family Medicine

## 2022-03-03 DIAGNOSIS — M5442 Lumbago with sciatica, left side: Secondary | ICD-10-CM

## 2022-03-06 ENCOUNTER — Ambulatory Visit
Admission: RE | Admit: 2022-03-06 | Discharge: 2022-03-06 | Disposition: A | Discharge status: Home / Self Care | Payer: BC Managed Care – PPO | Source: Ambulatory Visit | Attending: Family Medicine | Admitting: Family Medicine

## 2022-03-06 DIAGNOSIS — M5442 Lumbago with sciatica, left side: Secondary | ICD-10-CM | POA: Diagnosis present

## 2022-04-04 ENCOUNTER — Other Ambulatory Visit: Payer: Self-pay | Admitting: Neurology

## 2022-04-04 ENCOUNTER — Other Ambulatory Visit (HOSPITAL_COMMUNITY): Payer: Self-pay | Admitting: Neurology

## 2022-04-04 DIAGNOSIS — R200 Anesthesia of skin: Secondary | ICD-10-CM

## 2022-04-16 ENCOUNTER — Ambulatory Visit
Admission: RE | Admit: 2022-04-16 | Discharge: 2022-04-16 | Disposition: A | Discharge status: Home / Self Care | Payer: BC Managed Care – PPO | Source: Ambulatory Visit | Attending: Neurology | Admitting: Neurology

## 2022-04-16 DIAGNOSIS — R200 Anesthesia of skin: Secondary | ICD-10-CM | POA: Diagnosis present

## 2022-04-16 DIAGNOSIS — R202 Paresthesia of skin: Secondary | ICD-10-CM | POA: Diagnosis present

## 2022-04-16 MED ORDER — GADOBUTROL 1 MMOL/ML IV SOLN
6.0000 mL | Freq: Once | INTRAVENOUS | Status: AC | PRN
  Administered 2022-04-16: 6 mL via INTRAVENOUS

## 2022-05-06 ENCOUNTER — Ambulatory Visit: Payer: BC Managed Care – PPO | Attending: Infectious Diseases | Admitting: Infectious Diseases

## 2022-05-06 ENCOUNTER — Encounter: Payer: Self-pay | Admitting: Infectious Diseases

## 2022-05-06 VITALS — BP 116/72 | HR 74 | Temp 97.2°F | Wt 145.0 lb

## 2022-05-06 DIAGNOSIS — M79605 Pain in left leg: Secondary | ICD-10-CM | POA: Insufficient documentation

## 2022-05-06 DIAGNOSIS — G629 Polyneuropathy, unspecified: Secondary | ICD-10-CM

## 2022-05-06 DIAGNOSIS — R202 Paresthesia of skin: Secondary | ICD-10-CM | POA: Insufficient documentation

## 2022-05-06 DIAGNOSIS — Z8616 Personal history of COVID-19: Secondary | ICD-10-CM | POA: Diagnosis not present

## 2022-05-06 DIAGNOSIS — Z79899 Other long term (current) drug therapy: Secondary | ICD-10-CM | POA: Insufficient documentation

## 2022-05-06 NOTE — Patient Instructions (Addendum)
You are here for tingling in your body and not knowing what it is - lyme wastern blot IgM and IgG were normal- RPR, HIV all normal- y- currently there is no evidence to say you have lyme disease related neuropathy you are waiting for nerve conduction studies and decide on furtehr workup with Dr.Shah after that

## 2022-05-06 NOTE — Progress Notes (Signed)
NAME: Connie Velez  DOB: 08-10-1976  MRN: [id number]  Date/Time: 05/06/2022 11:11 AM  Subjective:  Pt referred to me By Dr.Shah for tingling and to r/o seronegative lyme disease ? Connie Velez is a 46 y.o. 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, hashimoto, graves, IVF, botox injections for the bells palsy since 2019 March 2023- had 3 days h/o flu like symptoms body ache, dizziness, no resp symptoms, took motrin Covid test was neg Past h/o 2 covid infections- ( June 2022, Dec 2021) Then she had a left  leg pain and was diagnosed with pyriformis syndrome Saw Chiropractor and had needling Tingling on the rt leg, then whole body, electric feeling Saw neurologist and had extensive work up  [employer] CBC/CMP/B1, B6, D, ESR, SIEP, Folate, ANA, RF, Sjogren, Celiac, MMA, copper, Vit e, HIV, LYME, RPR negative MRI brain on 04/16/22:MRI brain with and without contrast 04/16/2022 - 1. No evidence of acute intracranial abnormality. 2. Nonspecific 5 mm T2 FLAIR hyperintense remote insult within the mid-to-posterior left frontal lobe white matter, unchanged from the prior brain MRI of 0/22/2015. 3. Otherwise unremarkable MRI appearance of the brain. 4. Mild mucosal thickening within the bilateral ethmoid sinuses. 5. Trace fluid within the left mastoid air cells.    MRI lumbar spine 02/2022- n  As patient had concerns whether this could still be lyme disease with negative test she asked for ID opinion She had been to a lyme sepcialist and was given a questionaire and as she scored 47 she was asked to do some labs including urine test which would cost her $ 600. So she came to see me No tick bites  She did not know why she had bells palsy as a teenager She is in a very stressful job. She is now following a gluten free, sugar free diet She uses low dose OCT for endometriosis and never has a period- recently she had a three day period  Past Medical History:  Diagnosis  Date   Chilblains    Reynolds syndrome [facility])    Thyroid disease     Past Surgical History:  Procedure Laterality Date   BREAST BIOPSY Right    benign    Social History   Socioeconomic History   Marital status: Married    Spouse name: Not on file   Number of children: Not on file   Years of education: Not on file   Highest education level: Not on file  Occupational History   Not on file  Tobacco Use   Smoking status: Never   Smokeless tobacco: Never  Substance and Sexual Activity   Alcohol use: Not on file   Drug use: Not on file   Sexual activity: Not on file  Other Topics Concern   Not on file  Social History Narrative   Not on file   Social Determinants of Health   Financial Resource Strain: Not on file  Food Insecurity: Not on file  Transportation Needs: Not on file  Physical Activity: Not on file  Stress: Not on file  Social Connections: Not on file  Intimate Partner Violence: Not on file    No family history on file. Allergies  Allergen Reactions   Sulfa Antibiotics Anaphylaxis and Rash   Clarithromycin Nausea Only   Duloxetine Other (See Comments)    Nonspecific - did not tolerate   Erythromycin Nausea Only   Hydrocodone Bit-Homatrop Mbr Nausea Only   Sulfasalazine Hives   Sumatriptan Nausea And  Vomiting   I? Current Outpatient Medications  Medication Sig Dispense Refill   Ascorbic Acid (VITAMIN C PO) Take 1 tablet by mouth at bedtime.     Bacillus Coagulans-Inulin (PROBIOTIC) 1-250 BILLION-MG CAPS Take 1 tablet by mouth daily.     BIOTIN PO Take 1 tablet by mouth at bedtime.     Calcium Carbonate-Vitamin D (CALCIUM 600+D) 600-200 MG-UNIT TABS Take 1 tablet by mouth at bedtime.     fluticasone (FLONASE) 50 MCG/ACT nasal spray Place into the nose.     gabapentin (NEURONTIN) 100 MG capsule Take 600 mg by mouth daily.     ibuprofen (ADVIL) 200 MG tablet Take by mouth.     Ivermectin (SOOLANTRA) 1 % CREA Apply TO affected AREA ONCE A DAY.      Levothyroxine Sodium (TIROSINT) 50 MCG CAPS Take by mouth.     Multiple Vitamins-Minerals (WOMENS ONE DAILY) TABS Take 1 tablet by mouth at bedtime.     Norethindrone-Ethinyl Estradiol-Fe Biphas (LO LOESTRIN FE) 1 MG-10 MCG / 10 MCG tablet Take 1 tablet by mouth at bedtime.     vitamin B-12 (CYANOCOBALAMIN) 1000 MCG tablet Take 1,000 mcg by mouth at bedtime.     ferrous sulfate 325 (65 FE) MG tablet Take 325 mg by mouth at bedtime. (Patient not taking: Reported on 05/06/2022)     SYNTHROID 25 MCG tablet Take 62.5 mg by mouth daily. (Patient not taking: Reported on 05/06/2022)     No current facility-administered medications for this visit.     Abtx:  Anti-infectives (From admission, onward)    None       REVIEW OF SYSTEMS:  Const: negative fever, negative chills, negative weight loss Eyes: negative diplopia or visual changes, negative eye pain ENT: negative coryza, negative sore throat Resp: negative cough, hemoptysis, dyspnea Cards: negative for chest pain, palpitations, lower extremity edema GU: negative for frequency, dysuria and hematuria GI: Negative for abdominal pain, diarrhea, bleeding, constipation Skin: negative for rash and pruritus Heme: negative for easy bruising and gum/nose bleeding MS: negative for myalgias, arthralgias, back pain and muscle weakness Neurolo:negative for headaches, dizziness, vertigo, memory problems  Tingling, electric shock on different parts of her body Psych:  anxiety,  Endocrine: has thyroid disease Allergy/Immunology- as above: Objective:  VITALS:  BP 116/72   Pulse 74   Temp (!) 97.2 F (36.2 C) (Temporal)   Wt 145 lb (65.8 kg)   BMI 23.40 kg/m   PHYSICAL EXAM:  General: Alert, cooperative, no distress, appears stated age.  Head: Normocephalic, without obvious abnormality, atraumatic. Eyes: Conjunctivae clear, anicteric sclerae. Pupils are equal ENT Nares normal. No drainage or sinus tenderness. Lips, mucosa, and tongue normal. No  Thrush Neck: Supple, symmetrical, no adenopathy, thyroid: non tender no carotid bruit and no JVD. Back: No CVA tenderness. Lungs: Clear to auscultation bilaterally. No Wheezing or Rhonchi. No rales. Heart: Regular rate and rhythm, no murmur, rub or gallop. Abdomen: Soft, non-tender,not distended. Bowel sounds normal. No masses Extremities: atraumatic, no cyanosis. No edema. No clubbing Skin: tanned and changes of sun damage Lymph: Cervical, supraclavicular normal. Neurologic: left facial palsy- very faint Pertinent Labs As above? Impression/Recommendation ? Numbness and tingling of her entire body Lyme WB neg- ( no IgG r IgM bands)  this is not due to lyme  RPR and HIV neg as well Pt had extensive lab work up and also MRI brain- NCS/EMG pending -  From an Infectious disease point of view , I cannot explain her symptoms due to a  specific bacteria or virus . If NCS is abnormal neurology  may consider LP  ?H/o hashimoto throid disease  H/o endometriosis- currently on estrogen  H/o IVF ___________________________________________________ Discussed with patient in great detail- explained her infectious labs and all being neg. Told her that I would not recommend alternate management for test negative -seronegative lyme disease. Told her to check with gyn regarding perimenopausal symptoms/labs. Discussed stress management techniques Total time spent 45 min    Dragon voice recognition software and may include unintentional dictation errors.

## 2022-05-09 ENCOUNTER — Other Ambulatory Visit: Payer: Self-pay | Admitting: Student

## 2022-05-09 DIAGNOSIS — R200 Anesthesia of skin: Secondary | ICD-10-CM

## 2022-05-09 DIAGNOSIS — M542 Cervicalgia: Secondary | ICD-10-CM

## 2022-05-21 ENCOUNTER — Inpatient Hospital Stay: Admission: RE | Admit: 2022-05-21 | Payer: BC Managed Care – PPO | Source: Ambulatory Visit

## 2022-05-22 ENCOUNTER — Other Ambulatory Visit: Payer: Self-pay | Admitting: Neurology

## 2022-05-22 DIAGNOSIS — R200 Anesthesia of skin: Secondary | ICD-10-CM

## 2022-05-22 DIAGNOSIS — M542 Cervicalgia: Secondary | ICD-10-CM

## 2022-05-26 ENCOUNTER — Ambulatory Visit
Admission: RE | Admit: 2022-05-26 | Discharge: 2022-05-26 | Disposition: A | Discharge status: Home / Self Care | Payer: BC Managed Care – PPO | Source: Ambulatory Visit | Attending: Neurology | Admitting: Neurology

## 2022-05-26 ENCOUNTER — Encounter (HOSPITAL_COMMUNITY): Payer: Self-pay

## 2022-05-26 ENCOUNTER — Ambulatory Visit (HOSPITAL_COMMUNITY): Payer: BC Managed Care – PPO

## 2022-05-26 DIAGNOSIS — R200 Anesthesia of skin: Secondary | ICD-10-CM | POA: Insufficient documentation

## 2022-05-26 DIAGNOSIS — M542 Cervicalgia: Secondary | ICD-10-CM | POA: Insufficient documentation

## 2022-05-26 DIAGNOSIS — R202 Paresthesia of skin: Secondary | ICD-10-CM | POA: Insufficient documentation

## 2022-06-09 ENCOUNTER — Other Ambulatory Visit: Payer: BC Managed Care – PPO

## 2022-07-11 ENCOUNTER — Other Ambulatory Visit: Payer: Self-pay | Admitting: Student

## 2022-07-11 DIAGNOSIS — R2990 Unspecified symptoms and signs involving the nervous system: Secondary | ICD-10-CM

## 2022-07-17 ENCOUNTER — Ambulatory Visit
Admission: RE | Admit: 2022-07-17 | Discharge: 2022-07-17 | Disposition: A | Discharge status: Home / Self Care | Payer: BC Managed Care – PPO | Source: Ambulatory Visit | Attending: Student | Admitting: Student

## 2022-07-17 DIAGNOSIS — R2990 Unspecified symptoms and signs involving the nervous system: Secondary | ICD-10-CM | POA: Insufficient documentation

## 2022-07-22 ENCOUNTER — Encounter: Payer: Self-pay | Admitting: Infectious Diseases

## 2022-07-23 ENCOUNTER — Telehealth: Payer: Self-pay | Admitting: Infectious Diseases

## 2022-07-23 NOTE — Telephone Encounter (Signed)
Spoke to patient about the high varicella IgG > 3000 in serum which was ordered by her neurologist- Pt had messaged about this number. It just indicates that she has a robust response to a previous infection which could be chicken pox or shingles .cannot say when she had the infection.Cannot correlate this high number to her current symptoms of tingling in her extremities for which she has seen many specialists- She currently does not have active zoster lesions.  She will have to discuss with her neurologist Dr.Shah to explore whether LP is needed. Pt understands .

## 2022-09-15 ENCOUNTER — Encounter (INDEPENDENT_AMBULATORY_CARE_PROVIDER_SITE_OTHER): Payer: Self-pay

## 2022-10-18 ENCOUNTER — Emergency Department
Admission: EM | Admit: 2022-10-18 | Discharge: 2022-10-18 | Disposition: A | Discharge status: Home / Self Care | Payer: BC Managed Care – PPO | Attending: Emergency Medicine | Admitting: Emergency Medicine

## 2022-10-18 ENCOUNTER — Other Ambulatory Visit: Payer: Self-pay

## 2022-10-18 DIAGNOSIS — G510 Bell's palsy: Secondary | ICD-10-CM | POA: Diagnosis not present

## 2022-10-18 DIAGNOSIS — R42 Dizziness and giddiness: Secondary | ICD-10-CM

## 2022-10-18 DIAGNOSIS — R55 Syncope and collapse: Secondary | ICD-10-CM | POA: Diagnosis not present

## 2022-10-18 DIAGNOSIS — R7401 Elevation of levels of liver transaminase levels: Secondary | ICD-10-CM | POA: Insufficient documentation

## 2022-10-18 HISTORY — DX: Endometriosis, unspecified: N80.9

## 2022-10-18 LAB — COMPREHENSIVE METABOLIC PANEL
ALT: 168 U/L — ABNORMAL HIGH (ref 0–44)
AST: 55 U/L — ABNORMAL HIGH (ref 15–41)
Albumin: 4.3 g/dL (ref 3.5–5.0)
Alkaline Phosphatase: 56 U/L (ref 38–126)
Anion gap: 5 (ref 5–15)
BUN: 17 mg/dL (ref 6–20)
CO2: 27 mmol/L (ref 22–32)
Calcium: 9.5 mg/dL (ref 8.9–10.3)
Chloride: 108 mmol/L (ref 98–111)
Creatinine, Ser: 0.58 mg/dL (ref 0.44–1.00)
GFR, Estimated: >60 mL/min (ref >60)
Glucose, Bld: 102 mg/dL — ABNORMAL HIGH (ref 70–99)
Potassium: 4.9 mmol/L (ref 3.5–5.1)
Sodium: 140 mmol/L (ref 135–145)
Total Bilirubin: 0.8 mg/dL (ref 0.3–1.2)
Total Protein: 7.6 g/dL (ref 6.5–8.1)

## 2022-10-18 LAB — CBC WITH DIFFERENTIAL/PLATELET
Abs Immature Granulocytes: 0.01 10*3/uL (ref 0.00–0.07)
Basophils Absolute: 0 10*3/uL (ref 0.0–0.1)
Basophils Relative: 1 %
Eosinophils Absolute: 0.1 10*3/uL (ref 0.0–0.5)
Eosinophils Relative: 1 %
HCT: 40.9 % (ref 36.0–46.0)
Hemoglobin: 13.3 g/dL (ref 12.0–15.0)
Immature Granulocytes: 0 %
Lymphocytes Relative: 17 %
Lymphs Abs: 0.9 10*3/uL (ref 0.7–4.0)
MCH: 31.2 pg (ref 26.0–34.0)
MCHC: 32.5 g/dL (ref 30.0–36.0)
MCV: 96 fL (ref 80.0–100.0)
Monocytes Absolute: 0.6 10*3/uL (ref 0.1–1.0)
Monocytes Relative: 10 %
Neutro Abs: 3.9 10*3/uL (ref 1.7–7.7)
Neutrophils Relative %: 71 %
Platelets: 245 10*3/uL (ref 150–400)
RBC: 4.26 MIL/uL (ref 3.87–5.11)
RDW: 12 % (ref 11.5–15.5)
WBC: 5.5 10*3/uL (ref 4.0–10.5)
nRBC: 0 % (ref 0.0–0.2)

## 2022-10-18 LAB — TROPONIN I (HIGH SENSITIVITY): Troponin I (High Sensitivity): <2 ng/L (ref <18)

## 2022-10-18 NOTE — ED Triage Notes (Signed)
Pt states she was at the parade and when she was leaving she had an episode of dizziness and her chest felt fluttery and she was nauseous- pt states it lasted about 30 minutes- pt states she has had some food and water- pt states she feels a lot better, but still has a "brain foggy" feeling

## 2022-10-18 NOTE — ED Provider Notes (Signed)
[facility] Provider Note    Event Date/Time   First MD Initiated Contact with Patient 10/18/22 1426     (approximate)   History   Dizziness   HPI  Connie Velez is a 46 y.o. female   Past medical history of thyroid disease, endometriosis, presents with an episode of dizziness and nausea after attending a parade earlier today.  She was in her regular state of health and had breakfast this morning and attended her daughter's performance at a parade and return to her car and while seated in the car felt nauseous, dizziness, vertiginous symptoms that spontaneously resolved with rest.  She continues to have some "fogginess" but otherwise feels normal.  She denies drug or alcohol use.  She has been compliant with all medications and has had no changes recently.    History was obtained via the patient.  Her husband is at bedside as independent historian to offer collateral information corroborating information as above.  I reviewed an external medical note including a neurology visit dated 10/03/2022 for small fiber neuropathy numbness and tingling of the entire body for which she was noted to be taking gabapentin, naltrexone and ivermectin.      Physical Exam   Triage Vital Signs: ED Triage Vitals  Enc Vitals Group     BP 10/18/22 1306 (!) 132/59     Pulse Rate 10/18/22 1306 65     Resp 10/18/22 1306 18     Temp 10/18/22 1306 97.7 F (36.5 C)     Temp Source 10/18/22 1306 Oral     SpO2 10/18/22 1306 99 %     Weight 10/18/22 1307 150 lb (68 kg)     Height 10/18/22 1307 5\' 6"  (1.676 m)     Head Circumference --      Peak Flow --      Pain Score 10/18/22 1307 0     Pain Loc --      Pain Edu? --      Excl. in GC? --     Most recent vital signs: Vitals:   10/18/22 1306  BP: (!) 132/59  Pulse: 65  Resp: 18  Temp: 97.7 F (36.5 C)  SpO2: 99%    General: Awake, no distress.  CV:  Good peripheral perfusion.  Resp:  Normal effort.   Abd:  No distention.  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  She has no other focal neurologic deficits, alert and oriented and pleasant.  Moving all extremities with full active range of motion, motor and sensory intact, finger-nose normal and gait is stable normal.   ED Results / Procedures / Treatments   Labs (all labs ordered are listed, but only abnormal results are displayed) Labs Reviewed  COMPREHENSIVE METABOLIC PANEL - Abnormal; Notable for the following components:      Result Value   Glucose, Bld 102 (*)    AST 55 (*)    ALT 168 (*)    All other components within normal limits  CBC WITH DIFFERENTIAL/PLATELET  URINALYSIS, ROUTINE W REFLEX MICROSCOPIC  TROPONIN I (HIGH SENSITIVITY)  TROPONIN I (HIGH SENSITIVITY)     I reviewed labs and they are notable for elevated ALT and AST's 168 and 55 respectively.  EKG  ED ECG REPORT I, 14/02/23, the attending physician, personally viewed and interpreted this ECG.   Date: 10/18/2022  EKG Time: 1300  Rate: 72  Rhythm: normal sinus rhythm  Axis: nl  Intervals:none  ST&T Change: No acute ischemic changes    PROCEDURES:  Critical Care performed: No  Procedures   MEDICATIONS ORDERED IN ED: Medications - No data to display   IMPRESSION / MDM / ASSESSMENT AND PLAN / ED COURSE  I reviewed the triage vital signs and the nursing notes.                              Differential diagnosis includes, but is not limited to vasovagal syncope, electrolyte disturbance, ACS, dysrhythmia, CVA or head bleed    MDM: Patient with brief vertiginous symptoms and nausea that have since resolved.  She denies pain or any other symptoms, no infectious symptoms.  She has no trauma and no focal neurologic deficits so I doubt CVA or intracranial bleeding.  Electrolytes are within normal limits and her liver enzymes are elevated but she has no nausea or abdominal pain and a benign  abdominal exam with no right upper quadrant tenderness or rigidity or guarding.  She is a nondrinker.  EKG is nonischemic and has no malignant dysrhythmias noted.  I advised that she stay for further work-up including a pregnancy test but she declined.  I advised that she follow-up with her primary doctor for recheck of the liver tests, get a pregnancy test as soon as possible, and return to the emergency department with any redevelopment, worsening or new symptoms.   Patient's presentation is most consistent with acute presentation with potential threat to life or bodily function.       FINAL CLINICAL IMPRESSION(S) / ED DIAGNOSES   Final diagnoses:  Dizziness  Near syncope     Rx / DC Orders   ED Discharge Orders     None        Note:  This document was prepared using Dragon voice recognition software and may include unintentional dictation errors.    Pilar Jarvis, MD 10/18/22 [phone]

## 2022-10-18 NOTE — ED Provider Triage Note (Signed)
Emergency Medicine Provider Triage Evaluation Note  Connie Velez , a 46 y.o. female  was evaluated in triage.  Pt complains of dizziness, was at the parade started feel sluggish like she had been drugged, did not drink or eat anything at a parade, had yogurt before she left the house this morning.  Review of Systems  Positive:  Negative:   Physical Exam  There were no vitals taken for this visit. Gen:   Awake, no distress   Resp:  Normal effort  MSK:   Moves extremities without difficulty  Other:    Medical Decision Making  Medically screening exam initiated at 1:03 PM.  Appropriate orders placed.  Connie Velez was informed that the remainder of the evaluation will be completed by another provider, this initial triage assessment does not replace that evaluation, and the importance of remaining in the ED until their evaluation is complete.     Connie Ghee, PA-C 10/18/22 1303

## 2022-10-18 NOTE — Discharge Instructions (Addendum)
See your doctor this week to recheck your liver tests. Please have a pregnancy test done as soon as possible, since you did not want to have one in the emergency department.  If you develop any new or worsening symptoms your doctor right away.  Thank you for choosing [place] for your health care today!  Please see your primary doctor this week for a follow up appointment.   If you do not have a primary doctor call the following clinics to establish care:  If you have insurance:  [facility] [phone] [address] [place] [place] [address]   [facility]  [phone] [address] [place] [place] [address]   If you do not have insurance:  [facility]  [phone] [address] [place] [address]  Sometimes, in the early stages of certain disease courses it is difficult to detect in the emergency department evaluation -- so, it is important that you continue to monitor your symptoms and call your doctor right away or return to the emergency department if you develop any new or worsening symptoms.  It was my pleasure to care for you today.   Daneil Dan Modesto Charon, MD

## 2023-04-01 ENCOUNTER — Other Ambulatory Visit (INDEPENDENT_AMBULATORY_CARE_PROVIDER_SITE_OTHER): Payer: Self-pay | Admitting: Nurse Practitioner

## 2023-04-01 DIAGNOSIS — G5702 Lesion of sciatic nerve, left lower limb: Secondary | ICD-10-CM

## 2023-04-01 DIAGNOSIS — R200 Anesthesia of skin: Secondary | ICD-10-CM

## 2023-04-01 DIAGNOSIS — M79605 Pain in left leg: Secondary | ICD-10-CM

## 2023-04-06 ENCOUNTER — Ambulatory Visit (INDEPENDENT_AMBULATORY_CARE_PROVIDER_SITE_OTHER): Payer: BC Managed Care – PPO | Admitting: Vascular Surgery

## 2023-04-06 ENCOUNTER — Encounter (INDEPENDENT_AMBULATORY_CARE_PROVIDER_SITE_OTHER): Payer: Self-pay | Admitting: Vascular Surgery

## 2023-04-06 ENCOUNTER — Ambulatory Visit (INDEPENDENT_AMBULATORY_CARE_PROVIDER_SITE_OTHER): Payer: BC Managed Care – PPO

## 2023-04-06 VITALS — BP 118/66 | HR 64 | Resp 18 | Ht 66.0 in | Wt 151.6 lb

## 2023-04-06 DIAGNOSIS — M79605 Pain in left leg: Secondary | ICD-10-CM | POA: Diagnosis not present

## 2023-04-06 DIAGNOSIS — M79604 Pain in right leg: Secondary | ICD-10-CM

## 2023-04-06 DIAGNOSIS — R200 Anesthesia of skin: Secondary | ICD-10-CM

## 2023-04-06 DIAGNOSIS — I872 Venous insufficiency (chronic) (peripheral): Secondary | ICD-10-CM

## 2023-04-06 DIAGNOSIS — Z8639 Personal history of other endocrine, nutritional and metabolic disease: Secondary | ICD-10-CM | POA: Diagnosis not present

## 2023-04-06 DIAGNOSIS — I7300 Raynaud's syndrome without gangrene: Secondary | ICD-10-CM | POA: Diagnosis not present

## 2023-04-06 DIAGNOSIS — G5702 Lesion of sciatic nerve, left lower limb: Secondary | ICD-10-CM | POA: Diagnosis not present

## 2023-04-06 NOTE — Progress Notes (Signed)
MRN : [id number]  Connie Velez is a 47 y.o. ([birth year]) female who presents with chief complaint of check circulation.  History of Present Illness:   The patient is seen for evaluation of painful lower extremities. Patient notes the pain is variable and not always associated with activity.  The pain is somewhat consistent day to day occurring on most days. The patient notes the pain also occurs with standing and routinely seems worse as the day wears on. The pain has been progressive over the past several years. The patient states these symptoms are causing  a negative impact on quality of life and daily activities which was a factor in the referral.  The patient denies a  history of back problems and DJD of the lumbar and sacral spine.   The patient also notes her toes are associated with Raynaud's changes. The patient notes her toes turn blue and become mildly painful. Exposure to cold environments makes the symptoms much worse. The changes have been going on for years. There is no history of trauma or repetitive injury.   The patient has not been taking Norvasc  There is no history of malignancy or autoimmune disease.  The patient denies rest pain or dangling of an extremity off the side of the bed during the night for relief. No open wounds or sores at this time. No prior vascular interventions or surgeries.   No outpatient medications have been marked as taking for the 04/06/23 encounter (Appointment) with Gilda Crease, Latina Craver, MD.    Past Medical History:  Diagnosis Date   Chilblains    Endometriosis    Reynolds syndrome [facility])    Thyroid disease     Past Surgical History:  Procedure Laterality Date   BREAST BIOPSY Right    benign    Social History Social History   Tobacco Use   Smoking status: Never   Smokeless tobacco: Never    Family History No family history on file.  Allergies   Allergen Reactions   Sulfa Antibiotics Anaphylaxis and Rash   Clarithromycin Nausea Only   Duloxetine Other (See Comments)    Nonspecific - did not tolerate   Erythromycin Nausea Only   Hydrocodone Bit-Homatrop Mbr Nausea Only   Sulfasalazine Hives   Sumatriptan Nausea And Vomiting     REVIEW OF SYSTEMS (Negative unless checked)  Constitutional: [] Weight loss  [] Fever  [] Chills Cardiac: [] Chest pain   [] Chest pressure   [] Palpitations   [] Shortness of breath when laying flat   [] Shortness of breath with exertion. Vascular:  [x] Pain in legs with walking   [] Pain in legs at rest  [] History of DVT   [] Phlebitis   [] Swelling in legs   [] Varicose veins   [] Non-healing ulcers Pulmonary:   [] Uses home oxygen   [] Productive cough   [] Hemoptysis   [] Wheeze  [] COPD   [] Asthma Neurologic:  [] Dizziness   [] Seizures   [] History of stroke   [] History of TIA  [] Aphasia   [] Vissual changes   [] Weakness or numbness in arm   [] Weakness or numbness in leg Musculoskeletal:   [] Joint swelling   []   Joint pain   [] Low back pain Hematologic:  [] Easy bruising  [] Easy bleeding   [] Hypercoagulable state   [] Anemic Gastrointestinal:  [] Diarrhea   [] Vomiting  [] Gastroesophageal reflux/heartburn   [] Difficulty swallowing. Genitourinary:  [] Chronic kidney disease   [] Difficult urination  [] Frequent urination   [] Blood in urine Skin:  [] Rashes   [] Ulcers  Psychological:  [] History of anxiety   []  History of major depression.  Physical Examination  There were no vitals filed for this visit. There is no height or weight on file to calculate BMI. Gen: WD/WN, NAD Head: [place]/AT, No temporalis wasting.  Ear/Nose/Throat: Hearing grossly intact, nares w/o erythema or drainage Eyes: PER, EOMI, sclera nonicteric.  Neck: Supple, no masses.  No bruit or JVD.  Pulmonary:  Good air movement, no audible wheezing, no use of accessory muscles.  Cardiac: RRR, normal S1, S2, no Murmurs. Vascular:  mild Raynaud's changes of the  toes, no open wounds Vessel Right Left  Radial Palpable Palpable  PT Palpable  Palpable  DP  Palpable  Palpable  Gastrointestinal: soft, non-distended. No guarding/no peritoneal signs.  Musculoskeletal: M/S 5/5 throughout.  No visible deformity.  Neurologic: CN 2-12 intact. Pain and light touch intact in extremities.  Symmetrical.  Speech is fluent. Motor exam as listed above. Psychiatric: Judgment intact, Mood & affect appropriate for pt's clinical situation. Dermatologic: No rashes or ulcers noted.  No changes consistent with cellulitis.   CBC Lab Results  Component Value Date   WBC 5.5 10/18/2022   HGB 13.3 10/18/2022   HCT 40.9 10/18/2022   MCV 96.0 10/18/2022   PLT 245 10/18/2022    BMET    Component Value Date/Time   NA 140 10/18/2022 1310   K 4.9 10/18/2022 1310   CL 108 10/18/2022 1310   CO2 27 10/18/2022 1310   GLUCOSE 102 (H) 10/18/2022 1310   BUN 17 10/18/2022 1310   CREATININE 0.58 10/18/2022 1310   CALCIUM 9.5 10/18/2022 1310   GFRNONAA >60 10/18/2022 1310   GFRAA >60 10/26/2016 1029   CrCl cannot be calculated (Patient's most recent lab result is older than the maximum 21 days allowed.).  COAG No results found for: "INR", "PROTIME"  Radiology No results found.   Assessment/Plan 1. Pain in both lower extremities Recommend:  The patient has atypical pain symptoms for vascular disease and on exam I do not find evidence of vascular pathology that would explain the patient's symptoms.  Noninvasive studies do not identify significant vascular problems  I suspect the patient is c/o pseudoclaudication.  Patient should have an evaluation of the LS spine which I defer to the primary service or the Spine service.  The patient should continue walking and begin a more formal exercise program. The patient should continue his antiplatelet therapy and aggressive treatment of the lipid abnormalities.  Patient will follow-up with me on a PRN basis.  2. Raynaud's  phenomenon without gangrene Recommend:  The patient is currently tolerating the Raynaud's changes fairly well. Lengthy discussion regarding keeping the feet and the hands warm; gloves, washing with only warm water (especially avoiding cold water immersion) and using wool socks, specifically Smartwool was recommended.  The patient will follow up PRN if the changes worsen or persist.   3. Chronic venous insufficiency Recommend:  No surgery or intervention at this point in time.  I have reviewed my discussion with the patient regarding venous insufficiency and why it causes symptoms. I have discussed with the patient the chronic skin changes that accompany venous insufficiency and  the long term sequela such as ulceration. Patient will contnue wearing graduated compression stockings on a daily basis, as this has provided excellent control of his edema. The patient will put the stockings on first thing in the morning and removing them in the evening. The patient is reminded not to sleep in the stockings.  In addition, behavioral modification including elevation during the day will be initiated. Exercise is strongly encouraged.  Previous duplex ultrasound of the lower extremities shows normal deep system, no significant superficial reflux was identified.  Given the patient's good control and lack of any problems regarding the venous insufficiency and lymphedema a lymph pump in not need at this time.    The patient will follow up with me PRN should anything change.  The patient voices agreement with this plan.  4. History of hypothyroidism Continue hormone replacement as ordered and reviewed, no changes at this time    Levora Dredge, MD  04/06/2023 9:57 AM

## 2023-04-09 LAB — VAS US ABI WITH/WO TBI
Left ABI: 1.22
Right ABI: 1.28

## 2023-04-14 ENCOUNTER — Encounter (INDEPENDENT_AMBULATORY_CARE_PROVIDER_SITE_OTHER): Payer: Self-pay | Admitting: Vascular Surgery

## 2023-04-14 DIAGNOSIS — M79606 Pain in leg, unspecified: Secondary | ICD-10-CM | POA: Insufficient documentation

## 2023-07-24 ENCOUNTER — Ambulatory Visit: Payer: BC Managed Care – PPO

## 2023-07-24 DIAGNOSIS — Z800 Family history of malignant neoplasm of digestive organs: Secondary | ICD-10-CM | POA: Diagnosis not present

## 2023-07-24 DIAGNOSIS — Z1211 Encounter for screening for malignant neoplasm of colon: Secondary | ICD-10-CM | POA: Diagnosis present

## 2023-10-26 ENCOUNTER — Emergency Department
Admission: EM | Admit: 2023-10-26 | Discharge: 2023-10-26 | Disposition: A | Discharge status: Home / Self Care | Payer: BC Managed Care – PPO | Attending: Emergency Medicine | Admitting: Emergency Medicine

## 2023-10-26 ENCOUNTER — Encounter: Payer: Self-pay | Admitting: Emergency Medicine

## 2023-10-26 ENCOUNTER — Other Ambulatory Visit: Payer: Self-pay

## 2023-10-26 DIAGNOSIS — N390 Urinary tract infection, site not specified: Secondary | ICD-10-CM | POA: Insufficient documentation

## 2023-10-26 DIAGNOSIS — R102 Pelvic and perineal pain: Secondary | ICD-10-CM | POA: Diagnosis present

## 2023-10-26 DIAGNOSIS — R319 Hematuria, unspecified: Secondary | ICD-10-CM | POA: Insufficient documentation

## 2023-10-26 LAB — CBC WITH DIFFERENTIAL/PLATELET
Abs Immature Granulocytes: 0.02 10*3/uL (ref 0.00–0.07)
Basophils Absolute: 0 10*3/uL (ref 0.0–0.1)
Basophils Relative: 1 %
Eosinophils Absolute: 0.1 10*3/uL (ref 0.0–0.5)
Eosinophils Relative: 2 %
HCT: 41.6 % (ref 36.0–46.0)
Hemoglobin: 13.5 g/dL (ref 12.0–15.0)
Immature Granulocytes: 0 %
Lymphocytes Relative: 23 %
Lymphs Abs: 1.1 10*3/uL (ref 0.7–4.0)
MCH: 31.1 pg (ref 26.0–34.0)
MCHC: 32.5 g/dL (ref 30.0–36.0)
MCV: 95.9 fL (ref 80.0–100.0)
Monocytes Absolute: 0.5 10*3/uL (ref 0.1–1.0)
Monocytes Relative: 9 %
Neutro Abs: 3.3 10*3/uL (ref 1.7–7.7)
Neutrophils Relative %: 65 %
Platelets: 262 10*3/uL (ref 150–400)
RBC: 4.34 MIL/uL (ref 3.87–5.11)
RDW: 12.6 % (ref 11.5–15.5)
WBC: 5 10*3/uL (ref 4.0–10.5)
nRBC: 0 % (ref 0.0–0.2)

## 2023-10-26 LAB — URINALYSIS, ROUTINE W REFLEX MICROSCOPIC
Bilirubin Urine: NEGATIVE
Glucose, UA: NEGATIVE mg/dL
Ketones, ur: NEGATIVE mg/dL
Nitrite: NEGATIVE
Protein, ur: NEGATIVE mg/dL
Specific Gravity, Urine: >1.03 — ABNORMAL HIGH (ref 1.005–1.030)
pH: 5 (ref 5.0–8.0)

## 2023-10-26 LAB — BASIC METABOLIC PANEL
Anion gap: 8 (ref 5–15)
BUN: 23 mg/dL — ABNORMAL HIGH (ref 6–20)
CO2: 19 mmol/L — ABNORMAL LOW (ref 22–32)
Calcium: 9.1 mg/dL (ref 8.9–10.3)
Chloride: 112 mmol/L — ABNORMAL HIGH (ref 98–111)
Creatinine, Ser: 0.75 mg/dL (ref 0.44–1.00)
GFR, Estimated: >60 mL/min (ref >60)
Glucose, Bld: 96 mg/dL (ref 70–99)
Potassium: 5.2 mmol/L — ABNORMAL HIGH (ref 3.5–5.1)
Sodium: 139 mmol/L (ref 135–145)

## 2023-10-26 LAB — URINALYSIS, MICROSCOPIC (REFLEX)

## 2023-10-26 LAB — PREGNANCY, URINE: Preg Test, Ur: NEGATIVE

## 2023-10-26 MED ORDER — FLUCONAZOLE 150 MG PO TABS: 150.0000 mg | ORAL_TABLET | Freq: Every day | ORAL | 0 refills | Status: DC

## 2023-10-26 MED ORDER — CEPHALEXIN 500 MG PO CAPS: 500.0000 mg | ORAL_CAPSULE | Freq: Two times a day (BID) | ORAL | 0 refills | Status: AC

## 2023-10-26 NOTE — ED Triage Notes (Signed)
Patient to ED via POV for pelvic pain. States it started this AM. Pain feels similar to an ovarian cyst- had before. Denies buring with urination. States she did take a magnesium bath last PM.

## 2023-10-26 NOTE — Discharge Instructions (Addendum)
You were evaluated in the ED today for pelvic pain.  Your blood work was normal.  Urinalysis showed that you have some bacteria, this is consistent with a UTI.  Please take the antibiotics as prescribed.  Once you have completed the antibiotic course, please take the Diflucan to prevent development of yeast infection.  Your abdominal pain may be caused by a UTI but it may also be caused by an ovarian cyst.  You can follow-up with your OB/GYN as an outpatient for an ultrasound if you would like.  Please monitor your symptoms over the next few days.  If you have any worsening of your pain please return to the ED.

## 2023-10-26 NOTE — ED Notes (Signed)
See triage note. Pt endorses L flank pain as well. NAD noted. Pt denies fevers or chills. Pt is A&Ox4.

## 2023-10-26 NOTE — ED Provider Notes (Signed)
[facility] Provider Note    Event Date/Time   First MD Initiated Contact with Patient 10/26/23 0815     (approximate)   History   Pelvic Pain   HPI  Connie Velez is a 47 y.o. female with PMH of endometriosis, Reynolds syndrome, thyroid disease and chilblains who presents for evaluation of pelvic pain.  Patient states that when she got up this morning she developed pelvic pain that got progressively worse, to the point where she was bent over in pain.  She states that now that she has been in the ER her pain has resolved.  She mentions she took a hot bath last night which sometimes causes her to get a UTI, although she denies urinary symptoms at this time.  She also reports that she has had ovarian cyst before and feels like the pain is similar.      Physical Exam   Triage Vital Signs: ED Triage Vitals [10/26/23 0745]  Encounter Vitals Group     BP 120/71     Systolic BP Percentile      Diastolic BP Percentile      Pulse Rate 74     Resp 18     Temp 99.2 F (37.3 C)     Temp Source Oral     SpO2 100 %     Weight 140 lb (63.5 kg)     Height 5\' 6"  (1.676 m)     Head Circumference      Peak Flow      Pain Score 8     Pain Loc      Pain Education      Exclude from Growth Chart     Most recent vital signs: Vitals:   10/26/23 0745  BP: 120/71  Pulse: 74  Resp: 18  Temp: 99.2 F (37.3 C)  SpO2: 100%    General: Awake, no distress.  CV:  Good peripheral perfusion.  RRR. Resp:  Normal effort.  CTAB. Abd:  No distention.  Soft, nontender to palpation, bowel sounds appropriate. Other:     ED Results / Procedures / Treatments   Labs (all labs ordered are listed, but only abnormal results are displayed) Labs Reviewed  BASIC METABOLIC PANEL - Abnormal; Notable for the following components:      Result Value   Potassium 5.2 (*)    Chloride 112 (*)    CO2 19 (*)    BUN 23 (*)    All other components within normal limits   URINALYSIS, ROUTINE W REFLEX MICROSCOPIC - Abnormal; Notable for the following components:   Specific Gravity, Urine >1.030 (*)    Hgb urine dipstick SMALL (*)    Leukocytes,Ua TRACE (*)    All other components within normal limits  URINALYSIS, MICROSCOPIC (REFLEX) - Abnormal; Notable for the following components:   Bacteria, UA MANY (*)    All other components within normal limits  CBC WITH DIFFERENTIAL/PLATELET  PREGNANCY, URINE    PROCEDURES:  Critical Care performed: No  Procedures   MEDICATIONS ORDERED IN ED: Medications - No data to display   IMPRESSION / MDM / ASSESSMENT AND PLAN / ED COURSE  I reviewed the triage vital signs and the nursing notes.                             47 year old female presents for evaluation of pelvic pain.  Vital signs are stable and patient NAD on exam.  Differential diagnosis includes, but is not limited to, UTI, nephrolithiasis, endometriosis, ovarian cyst, appendicitis, hernia.  Patient's presentation is most consistent with acute complicated illness / injury requiring diagnostic workup.  CBC and BMP unremarkable.  Pregnancy test was negative.  Urinalysis notable for small amount hemoglobin, 6-10 WBCs, many bacteria and mucus present.    Given patient's symptoms I will treat for UTI.  I offered to do a pelvic ultrasound to evaluate for an ovarian cyst.  Patient declined at this time and preferred to follow-up with her OB/GYN as an outpatient.  Since she is not in any pain and she is hemodynamically stable I felt this was appropriate.  She is aware that she can return to the ED at any point for further workup.  She was given return precautions.  She voiced understanding, all questions were answered and she was stable at discharge.   FINAL CLINICAL IMPRESSION(S) / ED DIAGNOSES   Final diagnoses:  Urinary tract infection with hematuria, site unspecified     Rx / DC Orders   ED Discharge Orders          Ordered    cephALEXin  (KEFLEX) 500 MG capsule  2 times daily        10/26/23 0849    fluconazole (DIFLUCAN) 150 MG tablet  Daily,   Status:  Discontinued        10/26/23 0849    fluconazole (DIFLUCAN) 150 MG tablet  Daily        10/26/23 4098             Note:  This document was prepared using Dragon voice recognition software and may include unintentional dictation errors.   Cameron Ali, PA-C 10/26/23 1191    Jene Every, MD 10/26/23 [phone]

## 2024-02-12 ENCOUNTER — Ambulatory Visit: Admitting: Urology

## 2024-02-19 ENCOUNTER — Other Ambulatory Visit (INDEPENDENT_AMBULATORY_CARE_PROVIDER_SITE_OTHER): Payer: Self-pay | Admitting: Vascular Surgery

## 2024-02-19 DIAGNOSIS — I739 Peripheral vascular disease, unspecified: Secondary | ICD-10-CM

## 2024-02-21 NOTE — Progress Notes (Unsigned)
 MRN : [id number]  Connie Velez is a 48 y.o. (19-Feb-1976) female who presents with chief complaint of check circulation.  History of Present Illness:   The patient is seen for evaluation of painful lower extremities. Patient notes the pain is variable and not always associated with activity.  The pain is somewhat consistent day to day occurring on most days. The patient notes the pain also occurs with standing and routinely seems worse as the day wears on. The pain has been progressive over the past several years. The patient states these symptoms are causing  a negative impact on quality of life and daily activities which was a factor in the referral.   The patient denies a  history of back problems and DJD of the lumbar and sacral spine.    The patient also notes her toes are associated with Raynaud's changes. The patient notes her toes turn blue and become mildly painful. Exposure to cold environments makes the symptoms much worse. The changes have been going on for years. There is no history of trauma or repetitive injury.    The patient has not been taking Norvasc   There is no history of malignancy or autoimmune disease.   The patient denies rest pain or dangling of an extremity off the side of the bed during the night for relief. No open wounds or sores at this time. No prior vascular interventions or surgeries.  No outpatient medications have been marked as taking for the 02/22/24 encounter (Appointment) with Gilda Crease, Latina Craver, MD.    Past Medical History:  Diagnosis Date   Chilblains    Endometriosis    Reynolds syndrome [facility])    Thyroid disease     Past Surgical History:  Procedure Laterality Date   BREAST BIOPSY Right    benign    Social History Social History   Tobacco Use   Smoking status: Never   Smokeless tobacco: Never    Family History No family history on  file.  Allergies  Allergen Reactions   Sulfa Antibiotics Anaphylaxis and Rash   Clarithromycin Nausea Only   Duloxetine Other (See Comments)    Nonspecific - did not tolerate   Erythromycin Nausea Only   Hydrocodone Bit-Homatrop Mbr Nausea Only   Sulfasalazine Hives   Sumatriptan Nausea And Vomiting     REVIEW OF SYSTEMS (Negative unless checked)  Constitutional: [] Weight loss  [] Fever  [] Chills Cardiac: [] Chest pain   [] Chest pressure   [] Palpitations   [] Shortness of breath when laying flat   [] Shortness of breath with exertion. Vascular:  [x] Pain in legs with walking   [] Pain in legs at rest  [] History of DVT   [] Phlebitis   [] Swelling in legs   [] Varicose veins   [] Non-healing ulcers Pulmonary:   [] Uses home oxygen   [] Productive cough   [] Hemoptysis   [] Wheeze  [] COPD   [] Asthma Neurologic:  [] Dizziness   [] Seizures   [] History of stroke   [] History of TIA  [] Aphasia   [] Vissual changes   [] Weakness or numbness in arm   [] Weakness or numbness in leg Musculoskeletal:   []   Joint swelling   [] Joint pain   [] Low back pain Hematologic:  [] Easy bruising  [] Easy bleeding   [] Hypercoagulable state   [] Anemic Gastrointestinal:  [] Diarrhea   [] Vomiting  [] Gastroesophageal reflux/heartburn   [] Difficulty swallowing. Genitourinary:  [] Chronic kidney disease   [] Difficult urination  [] Frequent urination   [] Blood in urine Skin:  [] Rashes   [] Ulcers  Psychological:  [] History of anxiety   []  History of major depression.  Physical Examination  There were no vitals filed for this visit. There is no height or weight on file to calculate BMI. Gen: WD/WN, NAD Head: [place]/AT, No temporalis wasting.  Ear/Nose/Throat: Hearing grossly intact, nares w/o erythema or drainage Eyes: PER, EOMI, sclera nonicteric.  Neck: Supple, no masses.  No bruit or JVD.  Pulmonary:  Good air movement, no audible wheezing, no use of accessory muscles.  Cardiac: RRR, normal S1, S2, no Murmurs. Vascular:  mild trophic  changes, no open wounds Vessel Right Left  Radial Palpable Palpable  PT Not Palpable Not Palpable  DP Not Palpable Not Palpable  Gastrointestinal: soft, non-distended. No guarding/no peritoneal signs.  Musculoskeletal: M/S 5/5 throughout.  No visible deformity.  Neurologic: CN 2-12 intact. Pain and light touch intact in extremities.  Symmetrical.  Speech is fluent. Motor exam as listed above. Psychiatric: Judgment intact, Mood & affect appropriate for pt's clinical situation. Dermatologic: No rashes or ulcers noted.  No changes consistent with cellulitis.   CBC Lab Results  Component Value Date   WBC 5.0 10/26/2023   HGB 13.5 10/26/2023   HCT 41.6 10/26/2023   MCV 95.9 10/26/2023   PLT 262 10/26/2023    BMET    Component Value Date/Time   NA 139 10/26/2023 0747   K 5.2 (H) 10/26/2023 0747   CL 112 (H) 10/26/2023 0747   CO2 19 (L) 10/26/2023 0747   GLUCOSE 96 10/26/2023 0747   BUN 23 (H) 10/26/2023 0747   CREATININE 0.75 10/26/2023 0747   CALCIUM 9.1 10/26/2023 0747   GFRNONAA >60 10/26/2023 0747   GFRAA >60 10/26/2016 1029   CrCl cannot be calculated (Patient's most recent lab result is older than the maximum 21 days allowed.).  COAG No results found for: "INR", "PROTIME"  Radiology No results found.   Assessment/Plan There are no diagnoses linked to this encounter.   Levora Dredge, MD  02/21/2024 4:21 PM

## 2024-02-22 ENCOUNTER — Encounter (INDEPENDENT_AMBULATORY_CARE_PROVIDER_SITE_OTHER): Payer: Self-pay | Admitting: Vascular Surgery

## 2024-02-22 ENCOUNTER — Ambulatory Visit (INDEPENDENT_AMBULATORY_CARE_PROVIDER_SITE_OTHER)

## 2024-02-22 ENCOUNTER — Ambulatory Visit (INDEPENDENT_AMBULATORY_CARE_PROVIDER_SITE_OTHER): Admitting: Vascular Surgery

## 2024-02-22 VITALS — BP 110/71 | HR 63 | Resp 18 | Ht 66.5 in | Wt 145.0 lb

## 2024-02-22 DIAGNOSIS — G629 Polyneuropathy, unspecified: Secondary | ICD-10-CM

## 2024-02-22 DIAGNOSIS — I739 Peripheral vascular disease, unspecified: Secondary | ICD-10-CM

## 2024-02-22 DIAGNOSIS — I7300 Raynaud's syndrome without gangrene: Secondary | ICD-10-CM

## 2024-02-22 DIAGNOSIS — I872 Venous insufficiency (chronic) (peripheral): Secondary | ICD-10-CM | POA: Diagnosis not present

## 2024-02-22 DIAGNOSIS — M79604 Pain in right leg: Secondary | ICD-10-CM

## 2024-02-22 DIAGNOSIS — M79605 Pain in left leg: Secondary | ICD-10-CM | POA: Diagnosis not present

## 2024-02-23 NOTE — Progress Notes (Unsigned)
 Referring Physician:  Marisue Ivan, MD [id number] [facility] [address] [facility] [place],  [place] [address]  Primary Physician:  Marisue Ivan, MD  History of Present Illness: 02/23/2024 Connie Velez is here today with a chief complaint of ***   Low back pain radiating down left leg causing numbness and tingling.    Duration: *** Location: *** Quality: *** Severity: ***  Precipitating: aggravated by *** Modifying factors: made better by *** Weakness: none Timing: *** Bowel/Bladder Dysfunction: none  Conservative measures:  Physical therapy:  has participated in PT at [facility]. Multimodal medical therapy including regular antiinflammatories:  Gabapentin, Naltrexone, Ibuprofen Injections: no epidural steroid injections  Past Surgery: none  Connie Velez has ***no symptoms of cervical myelopathy.  The symptoms are causing a significant impact on the patient's life.   I have utilized the care everywhere function in epic to review the outside records available from external health systems.  Review of Systems:  A 10 point review of systems is negative, except for the pertinent positives and negatives detailed in the HPI.  Past Medical History: Past Medical History:  Diagnosis Date   Chilblains    Endometriosis    Reynolds syndrome [facility])    Thyroid disease     Past Surgical History: Past Surgical History:  Procedure Laterality Date   BREAST BIOPSY Right    benign    Allergies: Allergies as of 02/25/2024 - Review Complete 02/22/2024  Allergen Reaction Noted   Sulfa antibiotics Anaphylaxis and Rash 09/17/2012   Clarithromycin Nausea Only 03/14/2014   Duloxetine Other (See Comments) 03/28/2022   Erythromycin Nausea Only 07/18/2014   Hydrocodone bit-homatrop mbr Nausea Only 03/14/2014   Sulfasalazine Hives 10/26/2016   Sumatriptan Nausea And Vomiting 08/15/2021    Medications:  Current Outpatient Medications:    Ascorbic Acid  (VITAMIN C PO), Take 1 tablet by mouth at bedtime. (Patient not taking: Reported on 04/06/2023), Disp: , Rfl:    Bacillus Coagulans-Inulin (PROBIOTIC) 1-250 BILLION-MG CAPS, Take 1 tablet by mouth daily. (Patient not taking: Reported on 02/22/2024), Disp: , Rfl:    BIOTIN PO, Take 1 tablet by mouth at bedtime. (Patient not taking: Reported on 04/06/2023), Disp: , Rfl:    Calcium Carbonate-Vitamin D (CALCIUM 600+D) 600-200 MG-UNIT TABS, Take 1 tablet by mouth at bedtime. (Patient not taking: Reported on 04/06/2023), Disp: , Rfl:    ferrous sulfate 325 (65 FE) MG tablet, Take 325 mg by mouth at bedtime. (Patient not taking: Reported on 05/06/2022), Disp: , Rfl:    fluconazole (DIFLUCAN) 150 MG tablet, Take 1 tablet (150 mg total) by mouth daily., Disp: 2 tablet, Rfl: 0   fluticasone (FLONASE) 50 MCG/ACT nasal spray, Place into the nose., Disp: , Rfl:    gabapentin (NEURONTIN) 100 MG capsule, Take 600 mg by mouth daily., Disp: , Rfl:    gabapentin (NEURONTIN) 300 MG capsule, Take 300 mg by mouth 3 (three) times daily., Disp: , Rfl:    ibuprofen (ADVIL) 200 MG tablet, Take by mouth., Disp: , Rfl:    Ivermectin (SOOLANTRA) 1 % CREA, Apply TO affected AREA ONCE A DAY., Disp: , Rfl:    Levothyroxine Sodium (TIROSINT) 50 MCG CAPS, Take by mouth., Disp: , Rfl:    Multiple Vitamins-Minerals (WOMENS ONE DAILY) TABS, Take 1 tablet by mouth at bedtime., Disp: , Rfl:    Naltrexone HCl, Pain, (NALTREX) 4.5 MG CAPS, Take 4.5 capsules by mouth daily., Disp: , Rfl:    Norethindrone-Ethinyl Estradiol-Fe Biphas (LO LOESTRIN FE) 1 MG-10 MCG /  10 MCG tablet, Take 1 tablet by mouth at bedtime., Disp: , Rfl:    vitamin B-12 (CYANOCOBALAMIN) 1000 MCG tablet, Take 1,000 mcg by mouth at bedtime. (Patient not taking: Reported on 04/06/2023), Disp: , Rfl:   Social History: Social History   Tobacco Use   Smoking status: Never   Smokeless tobacco: Never    Family Medical History: No family history on file.  Physical  Examination: There were no vitals filed for this visit.  General: Patient is in no apparent distress. Attention to examination is appropriate.  Neck:   Supple.  Full range of motion.  Respiratory: Patient is breathing without any difficulty.   NEUROLOGICAL:     Awake, alert, oriented to person, place, and time.  Speech is clear and fluent.   Cranial Nerves: Pupils equal round and reactive to light.  Facial tone is symmetric.  Facial sensation is symmetric. Shoulder shrug is symmetric. Tongue protrusion is midline.  There is no pronator drift.  Strength: Side Biceps Triceps Deltoid Interossei Grip Wrist Ext. Wrist Flex.  R 5 5 5 5 5 5 5   L 5 5 5 5 5 5 5    Side Iliopsoas Quads Hamstring PF DF EHL  R 5 5 5 5 5 5   L 5 5 5 5 5 5    Reflexes are ***2+ and symmetric at the biceps, triceps, brachioradialis, patella and achilles.   Hoffman's is absent.   Bilateral upper and lower extremity sensation is intact to light touch.    No evidence of dysmetria noted.  Gait is normal.     Medical Decision Making  Imaging: ***  I have personally reviewed the images and agree with the above interpretation.  Assessment and Plan: Connie Velez is a pleasant 48 y.o. female with ***      Thank you for involving me in the care of this patient.      Dail Lerew K. Myer Haff MD, [facility] Neurosurgery

## 2024-02-25 ENCOUNTER — Ambulatory Visit (INDEPENDENT_AMBULATORY_CARE_PROVIDER_SITE_OTHER): Admitting: Neurosurgery

## 2024-02-25 ENCOUNTER — Encounter: Payer: Self-pay | Admitting: Neurosurgery

## 2024-02-25 VITALS — BP 136/78 | Ht 66.5 in | Wt 145.0 lb

## 2024-02-25 DIAGNOSIS — M79605 Pain in left leg: Secondary | ICD-10-CM

## 2024-02-25 DIAGNOSIS — G629 Polyneuropathy, unspecified: Secondary | ICD-10-CM

## 2024-02-26 ENCOUNTER — Encounter: Payer: Self-pay | Admitting: Radiology

## 2024-02-26 ENCOUNTER — Ambulatory Visit
Admission: RE | Admit: 2024-02-26 | Discharge: 2024-02-26 | Disposition: A | Discharge status: Home / Self Care | Source: Ambulatory Visit | Attending: Vascular Surgery | Admitting: Vascular Surgery

## 2024-02-26 DIAGNOSIS — M79605 Pain in left leg: Secondary | ICD-10-CM | POA: Insufficient documentation

## 2024-02-26 MED ORDER — IOHEXOL 350 MG/ML SOLN
100.0000 mL | Freq: Once | INTRAVENOUS | Status: AC | PRN
  Administered 2024-02-26: 100 mL via INTRAVENOUS

## 2024-02-29 ENCOUNTER — Telehealth (INDEPENDENT_AMBULATORY_CARE_PROVIDER_SITE_OTHER): Payer: Self-pay

## 2024-02-29 ENCOUNTER — Encounter (INDEPENDENT_AMBULATORY_CARE_PROVIDER_SITE_OTHER): Payer: Self-pay | Admitting: Vascular Surgery

## 2024-02-29 NOTE — Telephone Encounter (Signed)
 Patient will need an appointment scheduled with Dr Prescilla Brod to go over ct results.

## 2024-02-29 NOTE — Telephone Encounter (Signed)
 Connie Velez, Can you schedule a CT review with Dr. Prescilla Brod

## 2024-03-02 ENCOUNTER — Encounter: Payer: Self-pay | Admitting: Neurosurgery

## 2024-03-02 LAB — VAS US ABI WITH/WO TBI
Left ABI: 1.27
Right ABI: 1.39

## 2024-03-16 ENCOUNTER — Ambulatory Visit
Admission: RE | Admit: 2024-03-16 | Discharge: 2024-03-16 | Disposition: A | Discharge status: Home / Self Care | Source: Ambulatory Visit | Attending: Neurosurgery | Admitting: Neurosurgery

## 2024-03-16 DIAGNOSIS — M79605 Pain in left leg: Secondary | ICD-10-CM

## 2024-03-16 DIAGNOSIS — G629 Polyneuropathy, unspecified: Secondary | ICD-10-CM

## 2024-03-20 NOTE — Progress Notes (Signed)
 MRN : [id number]  Connie Velez is a 48 y.o. (10/30/1976) female who presents with chief complaint of check circulation.  History of Present Illness:   The patient returns to the office for followup regarding atherosclerotic changes of the lower extremities and review of the noninvasive studies.   There have been no interval changes in lower extremity symptoms. No interval shortening of the patient's claudication distance or development of rest pain symptoms. No new ulcers or wounds have occurred since the last visit.  There have been no significant changes to the patient's overall health care.  The patient denies amaurosis fugax or recent TIA symptoms. There are no documented recent neurological changes noted. There is no history of DVT, PE or superficial thrombophlebitis. The patient denies recent episodes of angina or shortness of breath.   CT scan aortobifemoral with runoff does not demonstrate any significant arterial pathology.  There is slight increased filling of the venous system in the left lower extremity perhaps consistent with her venous insufficiency  No outpatient medications have been marked as taking for the 03/21/24 encounter (Appointment) with Prescilla Brod, Ninette Basque, MD.    Past Medical History:  Diagnosis Date   Chilblains    Endometriosis    Reynolds syndrome [facility])    Thyroid disease     Past Surgical History:  Procedure Laterality Date   BREAST BIOPSY Right    benign    Social History Social History   Tobacco Use   Smoking status: Never   Smokeless tobacco: Never    Family History No family history on file.  Allergies  Allergen Reactions   Sulfa Antibiotics Anaphylaxis and Rash   Clarithromycin Nausea Only   Duloxetine Other (See Comments)    Nonspecific - did not tolerate   Duloxetine Hcl Other (See Comments)    Cymbalta   Erythromycin Nausea Only   Hydrocodone  Bit-Homatrop Mbr Nausea Only   Sulfasalazine Hives   Sumatriptan Nausea And Vomiting     REVIEW OF SYSTEMS (Negative unless checked)  Constitutional: [] Weight loss  [] Fever  [] Chills Cardiac: [] Chest pain   [] Chest pressure   [] Palpitations   [] Shortness of breath when laying flat   [] Shortness of breath with exertion. Vascular:  [x] Pain in legs with walking   [] Pain in legs at rest  [] History of DVT   [] Phlebitis   [] Swelling in legs   [] Varicose veins   [] Non-healing ulcers Pulmonary:   [] Uses home oxygen   [] Productive cough   [] Hemoptysis   [] Wheeze  [] COPD   [] Asthma Neurologic:  [] Dizziness   [] Seizures   [] History of stroke   [] History of TIA  [] Aphasia   [] Vissual changes   [] Weakness or numbness in arm   [] Weakness or numbness in leg Musculoskeletal:   [] Joint swelling   [] Joint pain   [] Low back pain Hematologic:  [] Easy bruising  [] Easy bleeding   [] Hypercoagulable state   [] Anemic Gastrointestinal:  [] Diarrhea   [] Vomiting  [] Gastroesophageal reflux/heartburn   [] Difficulty swallowing. Genitourinary:  [] Chronic kidney disease   [] Difficult urination  [] Frequent urination   [] Blood in urine Skin:  [] Rashes   []   Ulcers  Psychological:  [x] History of anxiety   []  History of major depression.  Physical Examination  There were no vitals filed for this visit. There is no height or weight on file to calculate BMI. Gen: WD/WN, NAD Head: [place]/AT, No temporalis wasting.  Ear/Nose/Throat: Hearing grossly intact, nares w/o erythema or drainage Eyes: PER, EOMI, sclera nonicteric.  Neck: Supple, no masses.  No bruit or JVD.  Pulmonary:  Good air movement, no audible wheezing, no use of accessory muscles.  Cardiac: RRR, normal S1, S2, no Murmurs. Vascular:  mild trophic changes, no open wounds Vessel Right Left  Radial Palpable Palpable  PT Palpable  Palpable  DP Palpable Palpable  Gastrointestinal: soft, non-distended. No guarding/no peritoneal signs.  Musculoskeletal: M/S 5/5  throughout.  No visible deformity.  Neurologic: CN 2-12 intact. Pain and light touch intact in extremities.  Symmetrical.  Speech is fluent. Motor exam as listed above. Psychiatric: Judgment intact, Mood & affect appropriate for pt's clinical situation. Dermatologic: No rashes or ulcers noted.  No changes consistent with cellulitis.   CBC Lab Results  Component Value Date   WBC 5.0 10/26/2023   HGB 13.5 10/26/2023   HCT 41.6 10/26/2023   MCV 95.9 10/26/2023   PLT 262 10/26/2023    BMET    Component Value Date/Time   NA 139 10/26/2023 0747   K 5.2 (H) 10/26/2023 0747   CL 112 (H) 10/26/2023 0747   CO2 19 (L) 10/26/2023 0747   GLUCOSE 96 10/26/2023 0747   BUN 23 (H) 10/26/2023 0747   CREATININE 0.75 10/26/2023 0747   CALCIUM 9.1 10/26/2023 0747   GFRNONAA >60 10/26/2023 0747   GFRAA >60 10/26/2016 1029   CrCl cannot be calculated (Patient's most recent lab result is older than the maximum 21 days allowed.).  COAG No results found for: "INR", "PROTIME"  Radiology VAS US  ABI WITH/WO TBI Result Date: 03/02/2024  LOWER EXTREMITY DOPPLER STUDY Patient Name:  Connie Velez  Date of Exam:   02/22/2024 Medical Rec #: [id number]               Accession #:    [id number] Date of Birth: January 21, 1976              Patient Gender: F Patient Age:   26 years Exam Location:  [facility] Procedure:      VAS US  ABI WITH/WO TBI Referring Phys: [facility] --------------------------------------------------------------------------------  Indications: Claudication. LLE numbness  Comparison Study: 04/06/2023 Performing Technologist: Tonie Franks RVS  Examination Guidelines: A complete evaluation includes at minimum, Doppler waveform signals and systolic blood pressure reading at the level of bilateral brachial, anterior tibial, and posterior tibial arteries, when vessel segments are accessible. Bilateral testing is considered an integral part of a complete examination. Photoelectric  Plethysmograph (PPG) waveforms and toe systolic pressure readings are included as required and additional duplex testing as needed. Limited examinations for reoccurring indications may be performed as noted.  ABI Findings: +---------+------------------+-----+---------+--------+ Right    Rt Pressure (mmHg)IndexWaveform Comment  +---------+------------------+-----+---------+--------+ Brachial 122                                      +---------+------------------+-----+---------+--------+ ATA      149               1.22 triphasic         +---------+------------------+-----+---------+--------+ PTA      169  1.39 triphasic         +---------+------------------+-----+---------+--------+ Great Toe107               0.88 Normal            +---------+------------------+-----+---------+--------+ +---------+------------------+-----+---------+-------+ Left     Lt Pressure (mmHg)IndexWaveform Comment +---------+------------------+-----+---------+-------+ Brachial 112                                     +---------+------------------+-----+---------+-------+ ATA      145               1.19 triphasic        +---------+------------------+-----+---------+-------+ PTA      155               1.27 triphasic        +---------+------------------+-----+---------+-------+ Great Toe115               0.94 Normal           +---------+------------------+-----+---------+-------+ +-------+-----------+-----------+------------+------------+ ABI/TBIToday's ABIToday's TBIPrevious ABIPrevious TBI +-------+-----------+-----------+------------+------------+ Right  1.39       .88        1.28        .75          +-------+-----------+-----------+------------+------------+ Left   1.27       .94        1.22        .95          +-------+-----------+-----------+------------+------------+ Left ABIs and TBIs appear essentially unchanged compared to prior study on  04/06/2023. Right ABIs appear essentially unchanged compared to prior study on 04/06/2023. Right TBIs appear to be increased compared to prior study on 04/06/2023.  Summary: Right: Resting right ankle-brachial index is within normal range. The right toe-brachial index is normal. Imaging of the PTA and ATA displays Mild plaque. Left: Resting left ankle-brachial index is within normal range. The left toe-brachial index is normal. Imaging of the PTA and ATA displays Mild plaque. *See table(s) above for measurements and observations.  Electronically signed by Devon Fogo MD on 03/02/2024 at 10:17:41 AM.    Final    CT ANGIO AO+BIFEM W & OR WO CONTRAST Result Date: 02/26/2024 CLINICAL DATA:  Left foot pain and numbness EXAM: CT ANGIOGRAPHY OF ABDOMINAL AORTA WITH ILIOFEMORAL RUNOFF TECHNIQUE: Multidetector CT imaging of the abdomen, pelvis and lower extremities was performed using the standard protocol during bolus administration of intravenous contrast. Multiplanar CT image reconstructions and MIPs were obtained to evaluate the vascular anatomy. RADIATION DOSE REDUCTION: This exam was performed according to the departmental dose-optimization program which includes automated exposure control, adjustment of the mA and/or kV according to patient size and/or use of iterative reconstruction technique. CONTRAST:  OMNIPAQUE  IOHEXOL  350 MG/ML SOLN COMPARISON:  None Available. FINDINGS: VASCULAR Aorta: Patent, no aneurysm. Celiac: Patent. SMA: Patent. Renals: Patent. IMA: Patent. RIGHT Lower Extremity Inflow: Patent. Outflow: Patent. Runoff: Patent. LEFT Lower Extremity Inflow: Patent. Outflow: Patent. Runoff: Patent.There is slight asymmetry in flow with increased venous phase imaging on the left, when compared to the right. Veins: IVC is grossly unremarkable.  Main portal vein is patent. Review of the MIP images confirms the above findings. NON-VASCULAR Lower chest: No acute abnormality. Hepatobiliary: No focal  liver abnormality is seen. No gallstones, gallbladder wall thickening, or biliary dilatation. Pancreas: Unremarkable. No pancreatic ductal dilatation or surrounding inflammatory changes. Spleen: Normal in size without focal abnormality. Adrenals/Urinary Tract: No hydronephrosis or significant  nephrolithiasis. Urinary bladder is decompressed. Stomach/Bowel: Nothing significant. Lymphatic: Nothing significant. Reproductive: Within normal limits. Other: No abdominal wall hernia or abnormality. No abdominopelvic ascites. Musculoskeletal: Nothing significant. IMPRESSION: 1. No evidence of significant vascular occlusion or peripheral vascular disease. 2. There is a slight increase in venous filling in the left lower extremity when compared to the right. This is of uncertain significance and may be technical in etiology. Additional considerations may include hyperemic states or inflammatory processes. Electronically Signed   By: Reagan Camera M.D.   On: 02/26/2024 11:04     Assessment/Plan 1. Pain in both lower extremities (Primary) Recommend:  The patient has atypical pain symptoms for vascular disease and on exam I do not find evidence of vascular pathology that would explain the patient's symptoms.  Noninvasive studies do not identify significant vascular problems  I suspect the patient is c/o pseudoclaudication.  Patient should have an evaluation of the LS spine which I defer to the primary service, pain management or the Spine service.  The patient should continue walking and begin a more formal exercise program. The patient should continue his antiplatelet therapy and aggressive treatment of the lipid abnormalities.  Patient will follow-up with me on a PRN basis.  2. Chronic venous insufficiency Recommend:  No surgery or intervention at this point in time.  I have reviewed my discussion with the patient regarding venous insufficiency and why it causes symptoms. I have discussed with the patient  the chronic skin changes that accompany venous insufficiency and the long term sequela such as ulceration. Patient will contnue wearing graduated compression stockings on a daily basis, as this has provided excellent control of his edema. The patient will put the stockings on first thing in the morning and removing them in the evening. The patient is reminded not to sleep in the stockings.  In addition, behavioral modification including elevation during the day will be initiated. Exercise is strongly encouraged.  Previous duplex ultrasound of the lower extremities shows normal deep system, no significant superficial reflux was identified.  Given the patient's good control and lack of any problems regarding the venous insufficiency and lymphedema a lymph pump in not need at this time.    The patient will follow up with me PRN should anything change.  The patient voices agreement with this plan.  3. Neuropathy See #1    Devon Fogo, MD  03/20/2024 2:45 PM

## 2024-03-21 ENCOUNTER — Encounter (INDEPENDENT_AMBULATORY_CARE_PROVIDER_SITE_OTHER): Payer: Self-pay | Admitting: Vascular Surgery

## 2024-03-21 ENCOUNTER — Ambulatory Visit (INDEPENDENT_AMBULATORY_CARE_PROVIDER_SITE_OTHER): Admitting: Vascular Surgery

## 2024-03-21 VITALS — BP 92/58 | HR 62 | Resp 16 | Ht 66.5 in | Wt 146.2 lb

## 2024-03-21 DIAGNOSIS — M79605 Pain in left leg: Secondary | ICD-10-CM

## 2024-03-21 DIAGNOSIS — G629 Polyneuropathy, unspecified: Secondary | ICD-10-CM | POA: Diagnosis not present

## 2024-03-21 DIAGNOSIS — M79604 Pain in right leg: Secondary | ICD-10-CM | POA: Diagnosis not present

## 2024-03-21 DIAGNOSIS — I872 Venous insufficiency (chronic) (peripheral): Secondary | ICD-10-CM | POA: Diagnosis not present

## 2024-03-23 ENCOUNTER — Ambulatory Visit (INDEPENDENT_AMBULATORY_CARE_PROVIDER_SITE_OTHER): Admitting: Urology

## 2024-03-23 ENCOUNTER — Encounter: Payer: Self-pay | Admitting: Urology

## 2024-03-23 ENCOUNTER — Ambulatory Visit: Admitting: Urology

## 2024-03-23 VITALS — BP 107/59 | HR 55 | Ht 66.0 in | Wt 146.0 lb

## 2024-03-23 DIAGNOSIS — N3289 Other specified disorders of bladder: Secondary | ICD-10-CM | POA: Diagnosis not present

## 2024-03-23 LAB — URINALYSIS, COMPLETE
Bilirubin, UA: NEGATIVE
Glucose, UA: NEGATIVE
Ketones, UA: NEGATIVE
Leukocytes,UA: NEGATIVE
Nitrite, UA: NEGATIVE
Protein,UA: NEGATIVE
Specific Gravity, UA: 1.025 (ref 1.005–1.030)
Urobilinogen, Ur: 0.2 mg/dL (ref 0.2–1.0)
pH, UA: 6 (ref 5.0–7.5)

## 2024-03-23 LAB — MICROSCOPIC EXAMINATION: Epithelial Cells (non renal): >10 /HPF — AB (ref 0–10)

## 2024-03-23 MED ORDER — OXYBUTYNIN CHLORIDE 5 MG PO TABS: 5.0000 mg | ORAL_TABLET | Freq: Three times a day (TID) | ORAL | 3 refills | Status: AC | PRN

## 2024-03-23 NOTE — Progress Notes (Signed)
 I, Maysun Jamey Mccallum, acting as a scribe for Geraline Knapp, MD., have documented all relevant documentation on the behalf of Geraline Knapp, MD, as directed by Geraline Knapp, MD while in the presence of Geraline Knapp, MD.  03/23/2024 2:56 PM   Connie Velez 02-18-1976 [id number]  Referring provider: Monique Ano, MD [phone] [facility] [address] [facility] [place],  [place] [address]  Chief Complaint  Patient presents with   Recurrent UTI    HPI: Connie Velez is a 48 y.o. female referred for evaluation of urinary symptoms,   Had 2 episodes in February and March, where she had complaints best described as bladder spasms/discomfort. She had no dysuria or significant frequency/urgency.  She had 1 urinalysis which showed 5 WBCs and one which was negative, however urine cultures were both showing insignificant growth.  She has had periodic episodes that will occur every few months.  Denies gross hematuria.  Seen in the ED December 2024 for pelvic pain. She was placed on antibiotics, however dipstick urine showed minimal abnormalities and a culture was not performed.   PMH: Past Medical History:  Diagnosis Date   Chilblains    Endometriosis    Reynolds syndrome [facility])    Thyroid disease     Surgical History: Past Surgical History:  Procedure Laterality Date   BREAST BIOPSY Right    benign    Home Medications:  Allergies as of 03/23/2024       Reactions   Sulfa Antibiotics Anaphylaxis, Rash   Clarithromycin Nausea Only   Duloxetine Other (See Comments)   Nonspecific - did not tolerate   Duloxetine Hcl Other (See Comments)   Cymbalta   Erythromycin Nausea Only   Hydrocodone Bit-homatrop Mbr Nausea Only   Sulfasalazine Hives   Sumatriptan Nausea And Vomiting        Medication List        Accurate as of Mar 23, 2024  2:56 PM. If you have any questions, ask your nurse or doctor.          cyanocobalamin 1000 MCG tablet Commonly  known as: VITAMIN B12 Take 1,000 mcg by mouth at bedtime.   fluticasone 50 MCG/ACT nasal spray Commonly known as: FLONASE Place into the nose.   gabapentin 300 MG capsule Commonly known as: NEURONTIN Take 300 mg by mouth 3 (three) times daily.   gabapentin 100 MG capsule Commonly known as: NEURONTIN Take 600 mg by mouth daily.   ibuprofen 200 MG tablet Commonly known as: ADVIL Take by mouth.   Norethindrone-Ethinyl Estradiol-Fe Biphas 1 MG-10 MCG / 10 MCG tablet Commonly known as: LO LOESTRIN FE Take 1 tablet by mouth at bedtime.   oxybutynin 5 MG tablet Commonly known as: DITROPAN Take 1 tablet (5 mg total) by mouth every 8 (eight) hours as needed for bladder spasms. Started by: Geraline Knapp   Tirosint 50 MCG Caps Generic drug: Levothyroxine Sodium Take by mouth.   Womens One Daily Tabs Take 1 tablet by mouth at bedtime.        Allergies:  Allergies  Allergen Reactions   Sulfa Antibiotics Anaphylaxis and Rash   Clarithromycin Nausea Only   Duloxetine Other (See Comments)    Nonspecific - did not tolerate   Duloxetine Hcl Other (See Comments)    Cymbalta   Erythromycin Nausea Only   Hydrocodone Bit-Homatrop Mbr Nausea Only   Sulfasalazine Hives   Sumatriptan Nausea And Vomiting    Social History:  reports that she has  never smoked. She has never used smokeless tobacco. No history on file for alcohol use and drug use.   Physical Exam: BP (!) 107/59   Pulse (!) 55   Ht 5\' 6"  (1.676 m)   Wt 146 lb (66.2 kg)   BMI 23.57 kg/m   Constitutional:  Alert and oriented, No acute distress. HEENT: [place] AT Respiratory: Normal respiratory effort, no increased work of breathing. Psychiatric: Normal mood and affect.  Urinalysis Dipstick trace blood microscopy 3-10 RBC/>10 epis    Assessment & Plan:    48 year-old female with intermittent pelvic pain consistent with bladder spasm.  Urinalysis have shown no significant abnormalities. Her UA today shows 3-10  RBCs, however significant vaginal contamination with >10 epis.  Recommend a trial of immediate release of Oxybutynin IR 5 mg Q8 hours PRN when episodes occur.  Follow up 3 months for a recheck.  Instructed to call earlier for recurrent symptoms, as she has presently been asymptomatic.  I have reviewed the above documentation for accuracy and completeness, and I agree with the above.   Geraline Knapp, MD  [facility] [address] [place], [place] [address] [phone]

## 2024-03-27 ENCOUNTER — Encounter (INDEPENDENT_AMBULATORY_CARE_PROVIDER_SITE_OTHER): Payer: Self-pay | Admitting: Vascular Surgery

## 2024-03-28 ENCOUNTER — Encounter: Payer: Self-pay | Admitting: Neurosurgery

## 2024-04-05 ENCOUNTER — Encounter (INDEPENDENT_AMBULATORY_CARE_PROVIDER_SITE_OTHER): Payer: Self-pay

## 2024-05-09 ENCOUNTER — Encounter: Payer: Self-pay | Admitting: Urology

## 2024-05-09 NOTE — Telephone Encounter (Signed)
 Spoke with patient and advised that we do not have an availability this afternoon but we do have an opening in the morning. Patient is not able to do that, patient stated that she thought Dr Twylla told her that if she felt that she thought she was getting a UTI she could call and could drop off UA sample to check. Advised patient that may have been a misunderstanding and that we do need to have an office visit with a UA check and I apologized for the miscommunication/misunderstanding. Patient stated she would check with her PCP at this time.

## 2024-06-24 ENCOUNTER — Ambulatory Visit: Admitting: Urology

## 2024-07-01 ENCOUNTER — Encounter: Payer: Self-pay | Admitting: Urology
